# Patient Record
Sex: Female | Born: 1990
Health system: Southern US, Community
[De-identification: ages and names within clinical notes are randomized; demographics above are authoritative.]

## PROBLEM LIST (undated history)

## (undated) ENCOUNTER — Inpatient Hospital Stay: Payer: Self-pay

## (undated) DIAGNOSIS — N390 Urinary tract infection, site not specified: Secondary | ICD-10-CM

## (undated) DIAGNOSIS — R51 Headache: Secondary | ICD-10-CM

## (undated) DIAGNOSIS — F419 Anxiety disorder, unspecified: Secondary | ICD-10-CM

## (undated) DIAGNOSIS — F329 Major depressive disorder, single episode, unspecified: Secondary | ICD-10-CM

## (undated) DIAGNOSIS — F32A Depression, unspecified: Secondary | ICD-10-CM

## (undated) DIAGNOSIS — R519 Headache, unspecified: Secondary | ICD-10-CM

## (undated) HISTORY — DX: Anxiety disorder, unspecified: F41.9

## (undated) HISTORY — DX: Headache: R51

## (undated) HISTORY — DX: Major depressive disorder, single episode, unspecified: F32.9

## (undated) HISTORY — DX: Headache, unspecified: R51.9

## (undated) HISTORY — PX: ESOPHAGOGASTRODUODENOSCOPY: SHX1529

## (undated) HISTORY — DX: Depression, unspecified: F32.A

## (undated) HISTORY — DX: Urinary tract infection, site not specified: N39.0

---
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
## 2008-01-28 IMAGING — US US PELV - US TRANSVAGINAL
1 series · 17 of 25 positions shown · non-contrast
Comparison: none

REASON FOR EXAM: pelvic pain
COMMENTS:

[Series 1: us pelv - us transvaginal · 17 of 29 slices shown]
[im 1/29]
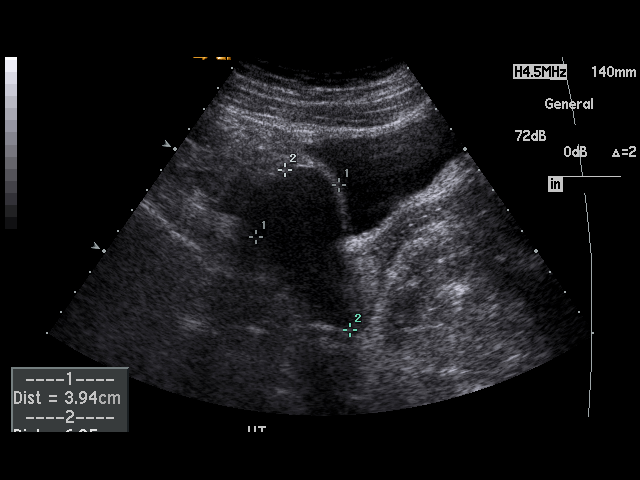
[im 3/29]
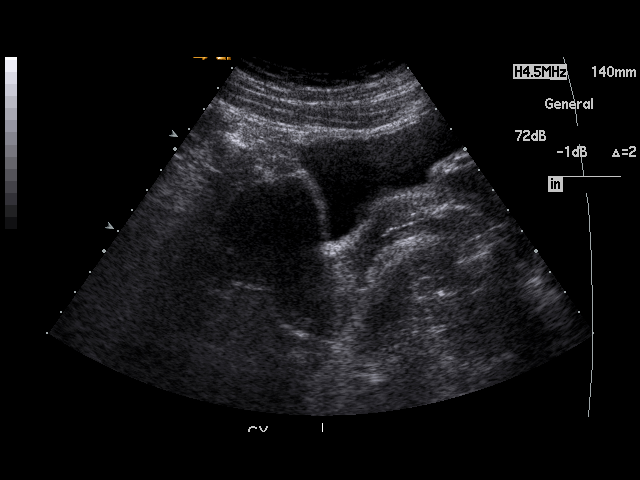
[im 4/29]
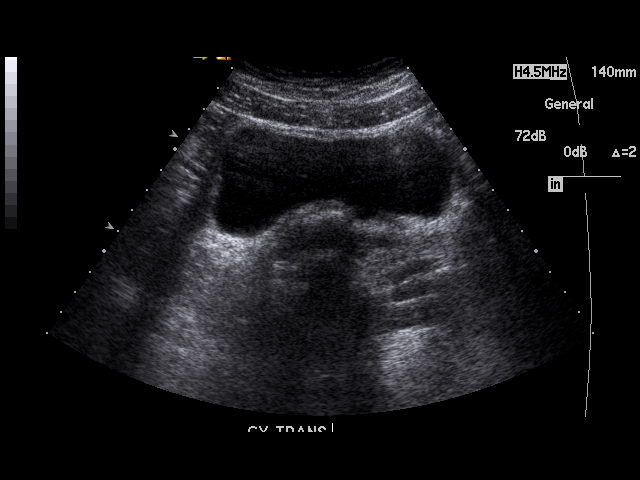
[im 6/29]
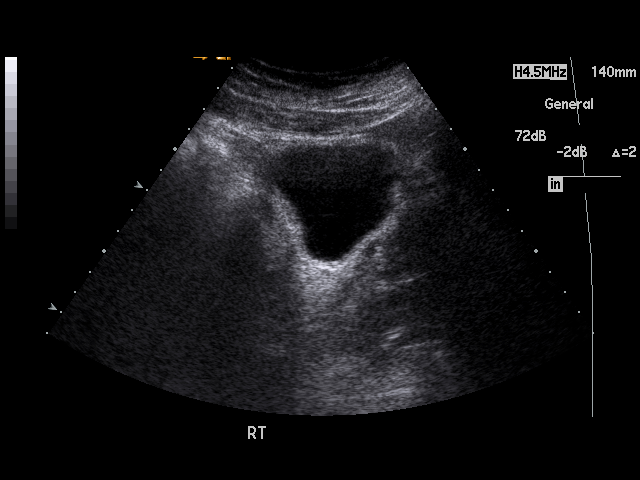
[im 8/29]
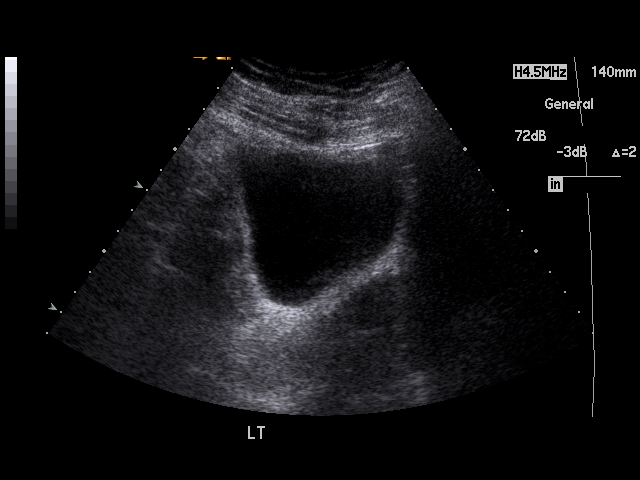
[im 10/29]
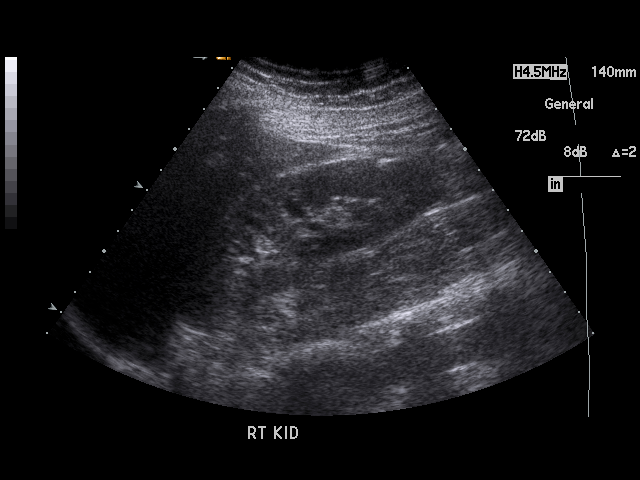
[im 11/29]
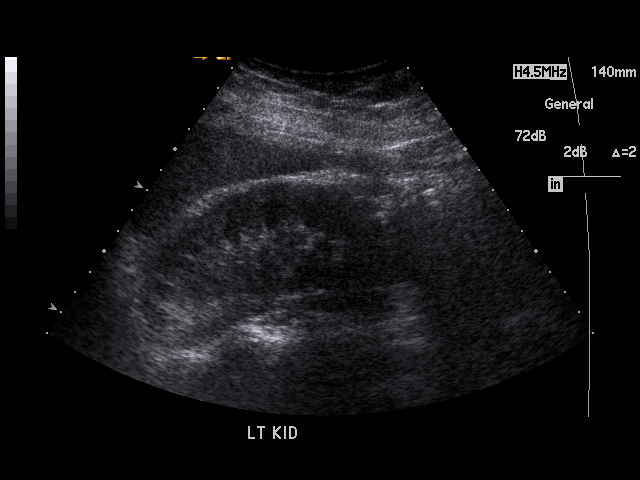
[im 13/29]
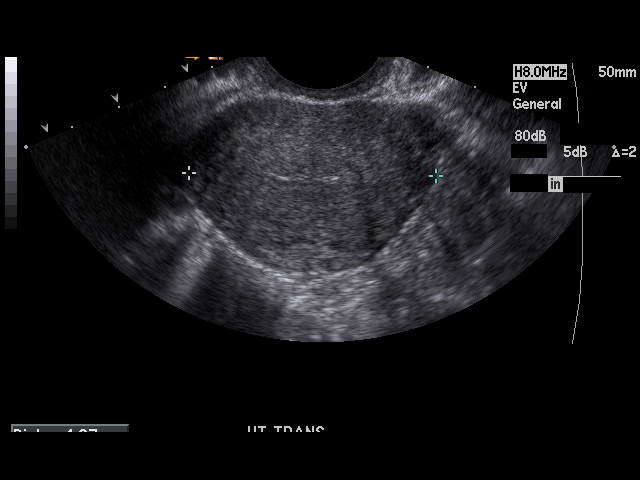
[im 15/29]
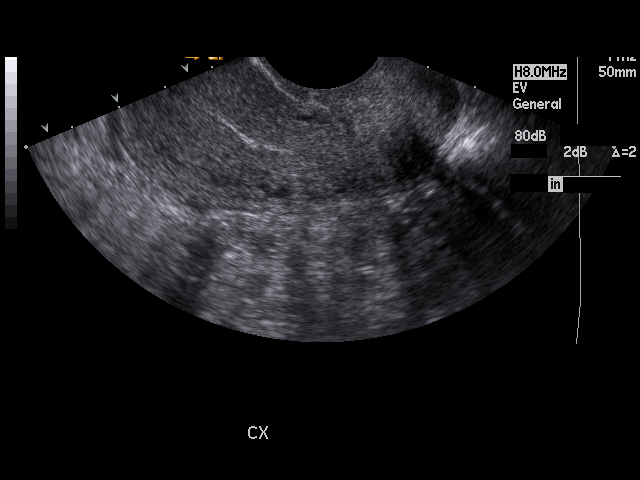
[im 16/29]
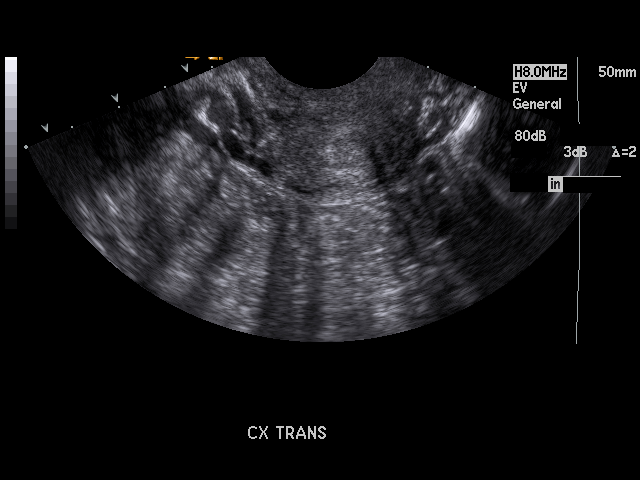
[im 18/29]
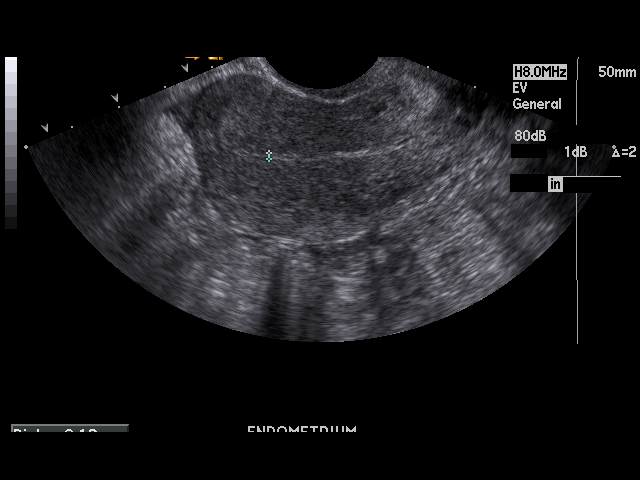
[im 19/29]
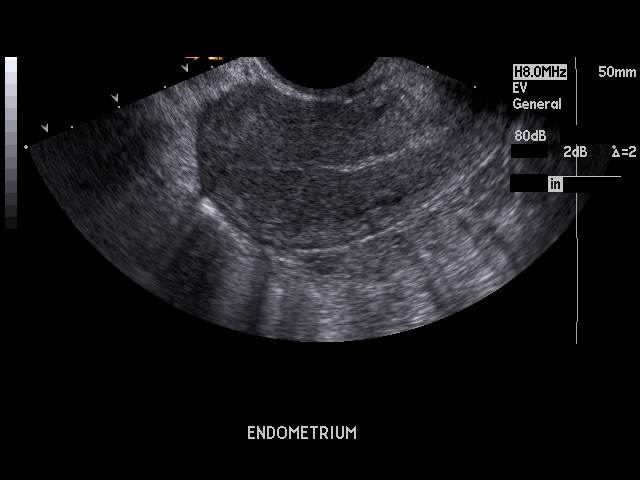
[im 22/29]
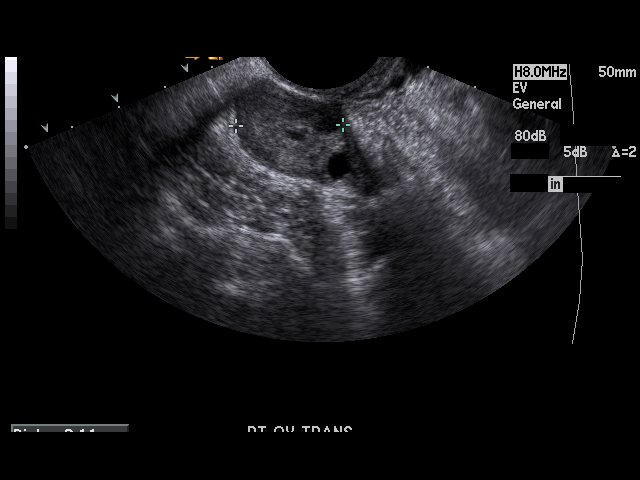
[im 23/29]
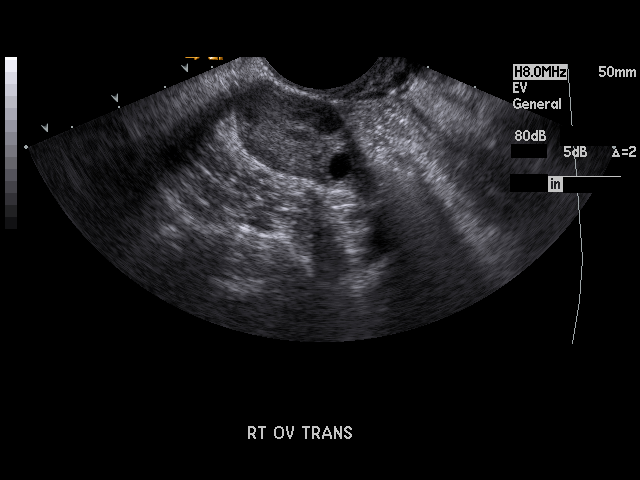
[im 25/29]
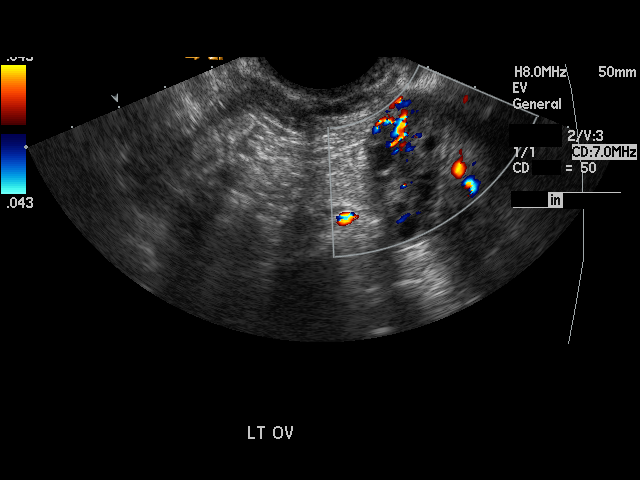
[im 26/29]
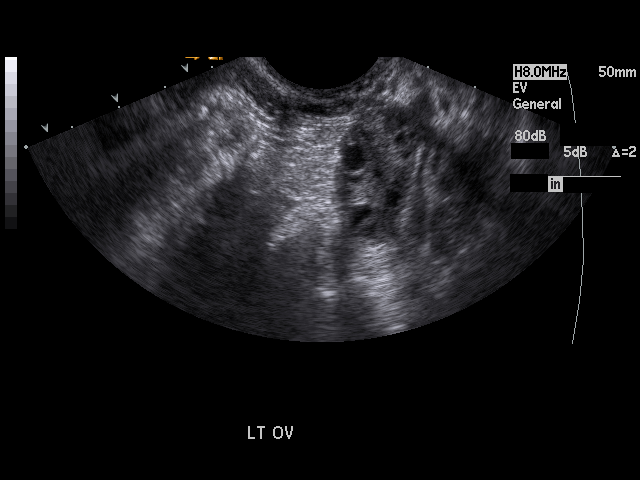
[im 29/29]
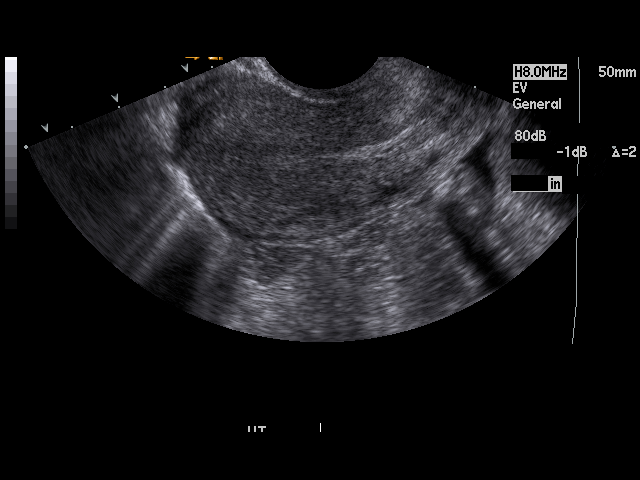

[17 of 25 positions shown; findings below may reference images not displayed]

PROCEDURE:     US  - US PELVIS MASS EXAM  - [DATE] [DATE] [DATE]  [DATE]

RESULT:     The uterus measures 6.77 cm x 3.18 cm x 4.87 cm.  The
endometrium measures 1.2 mm in thickness.  No uterine mass lesions are seen.
The RIGHT ovary measures 3.12 cm at maximum diameter and the LEFT ovary
measures 3.04 cm at maximum diameter. No abnormal adnexal masses are seen.
There is no free fluid in the pelvis. The kidneys show no hydronephrosis.
There is no ascites. The visualized portion of the urinary bladder is normal
in appearance.
IMPRESSION: 1. No significant abnormalities are identified.
2. Transabdominal and endovaginal ultrasound was performed.

## 2008-03-01 ENCOUNTER — Emergency Department: Payer: Self-pay | Admitting: Emergency Medicine
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
## 2009-12-12 ENCOUNTER — Emergency Department: Payer: Self-pay | Admitting: Emergency Medicine

## 2010-02-01 ENCOUNTER — Emergency Department: Payer: Self-pay | Admitting: Unknown Physician Specialty

## 2011-09-09 ENCOUNTER — Emergency Department: Payer: Self-pay | Admitting: Emergency Medicine

## 2011-09-09 LAB — COMPREHENSIVE METABOLIC PANEL
Albumin: 3 g/dL — ABNORMAL LOW (ref 3.4–5.0)
Alkaline Phosphatase: 44 U/L — ABNORMAL LOW (ref 50–136)
Anion Gap: 9 (ref 7–16)
BUN: 6 mg/dL — ABNORMAL LOW (ref 7–18)
Bilirubin,Total: 0.2 mg/dL (ref 0.2–1.0)
Calcium, Total: 8.4 mg/dL — ABNORMAL LOW (ref 8.5–10.1)
Co2: 23 mmol/L (ref 21–32)
Creatinine: 0.41 mg/dL — ABNORMAL LOW (ref 0.60–1.30)
EGFR (African American): >60
Osmolality: 272 (ref 275–301)
Potassium: 3.9 mmol/L (ref 3.5–5.1)
SGPT (ALT): 14 U/L
Sodium: 138 mmol/L (ref 136–145)

## 2011-09-09 LAB — URINALYSIS, COMPLETE
Bilirubin,UR: NEGATIVE
Ph: 7 (ref 4.5–8.0)
Protein: NEGATIVE
RBC,UR: 1 /HPF (ref 0–5)
Specific Gravity: 1.012 (ref 1.003–1.030)

## 2011-09-09 LAB — CBC
HCT: 36.7 % (ref 35.0–47.0)
MCH: 30.5 pg (ref 26.0–34.0)
MCHC: 34.9 g/dL (ref 32.0–36.0)
MCV: 87 fL (ref 80–100)
Platelet: 180 10*3/uL (ref 150–440)
RBC: 4.21 10*6/uL (ref 3.80–5.20)
WBC: 10.5 10*3/uL (ref 3.6–11.0)

## 2011-09-11 LAB — URINE CULTURE

## 2011-09-25 ENCOUNTER — Emergency Department: Payer: Self-pay | Admitting: Unknown Physician Specialty

## 2011-09-25 LAB — URINALYSIS, COMPLETE
Glucose,UR: NEGATIVE mg/dL (ref 0–75)
Ketone: NEGATIVE
Ph: 8 (ref 4.5–8.0)
Protein: NEGATIVE
RBC,UR: 1 /HPF (ref 0–5)
Squamous Epithelial: 5

## 2011-09-25 LAB — COMPREHENSIVE METABOLIC PANEL
Albumin: 3.1 g/dL — ABNORMAL LOW (ref 3.4–5.0)
Anion Gap: 10 (ref 7–16)
Bilirubin,Total: 0.4 mg/dL (ref 0.2–1.0)
Calcium, Total: 8.3 mg/dL — ABNORMAL LOW (ref 8.5–10.1)
Chloride: 107 mmol/L (ref 98–107)
Creatinine: 0.49 mg/dL — ABNORMAL LOW (ref 0.60–1.30)
EGFR (African American): >60
EGFR (Non-African Amer.): >60
Osmolality: 274 (ref 275–301)
Potassium: 3.8 mmol/L (ref 3.5–5.1)
SGOT(AST): 11 U/L — ABNORMAL LOW (ref 15–37)
SGPT (ALT): 16 U/L
Total Protein: 6.7 g/dL (ref 6.4–8.2)

## 2011-09-25 LAB — HCG, QUANTITATIVE, PREGNANCY: Beta Hcg, Quant.: 20276 m[IU]/mL — ABNORMAL HIGH

## 2011-09-25 LAB — CBC
HGB: 13.3 g/dL (ref 12.0–16.0)
MCHC: 34.1 g/dL (ref 32.0–36.0)
MCV: 90 fL (ref 80–100)
Platelet: 140 10*3/uL — ABNORMAL LOW (ref 150–440)
RBC: 4.36 10*6/uL (ref 3.80–5.20)
RDW: 13.7 % (ref 11.5–14.5)

## 2012-02-19 ENCOUNTER — Observation Stay: Payer: Self-pay

## 2012-03-10 ENCOUNTER — Inpatient Hospital Stay: Payer: Self-pay

## 2012-03-10 LAB — CBC WITH DIFFERENTIAL/PLATELET
Basophil #: 0 10*3/uL (ref 0.0–0.1)
Eosinophil #: 0 10*3/uL (ref 0.0–0.7)
Eosinophil %: 0.3 %
HCT: 37.7 % (ref 35.0–47.0)
HGB: 13.4 g/dL (ref 12.0–16.0)
Lymphocyte #: 1.9 10*3/uL (ref 1.0–3.6)
Lymphocyte %: 16.6 %
MCHC: 35.4 g/dL (ref 32.0–36.0)
MCV: 90 fL (ref 80–100)
Monocyte #: 0.5 x10 3/mm (ref 0.2–0.9)
Neutrophil #: 8.9 10*3/uL — ABNORMAL HIGH (ref 1.4–6.5)
RBC: 4.21 10*6/uL (ref 3.80–5.20)
RDW: 13.3 % (ref 11.5–14.5)

## 2012-03-12 LAB — HEMATOCRIT: HCT: 32 % — ABNORMAL LOW (ref 35.0–47.0)

## 2013-06-20 ENCOUNTER — Ambulatory Visit: Payer: Self-pay | Admitting: Podiatry

## 2013-07-04 ENCOUNTER — Encounter: Payer: Self-pay | Admitting: Podiatry

## 2013-07-04 ENCOUNTER — Ambulatory Visit (INDEPENDENT_AMBULATORY_CARE_PROVIDER_SITE_OTHER): Payer: BC Managed Care – PPO | Admitting: Podiatry

## 2013-07-04 VITALS — BP 129/84 | HR 61 | Resp 16 | Ht 71.0 in | Wt 168.0 lb

## 2013-07-04 DIAGNOSIS — L600 Ingrowing nail: Secondary | ICD-10-CM

## 2013-07-04 DIAGNOSIS — M898X9 Other specified disorders of bone, unspecified site: Secondary | ICD-10-CM

## 2013-07-04 MED ORDER — NEOMYCIN-POLYMYXIN-HC 3.5-10000-1 OT SOLN: OTIC | Status: DC

## 2013-07-04 NOTE — Patient Instructions (Addendum)

## 2013-07-04 NOTE — Progress Notes (Signed)
   Subjective:    Patient ID: Adrienne Blair, female    DOB: December 25, 1990, 23 y.o.   MRN: [id number]  HPI Comments: Toenails are not growing right, both great toenails had trauma to them years ago, also both #2 toenails growing up not out.     Review of Systems  All other systems reviewed and are negative.       Objective:   Physical Exam        Assessment & Plan:

## 2013-07-04 NOTE — Progress Notes (Signed)
Subjective:     Patient ID: Adrienne Blair, female   DOB: 09-Mar-1991, 23 y.o.   MRN: [id number]  HPI patient presents stating I have had trauma to mycotic nails and my second nails of both feet and it's been this way for about 10 years. When they grow out they become painful I. have a lot of problems trimming them and they have been nothing but a nuisance for me   Review of Systems  All other systems reviewed and are negative.       Objective:   Physical Exam  Nursing note and vitals reviewed. Constitutional: She is oriented to person, place, and time.  Cardiovascular: Intact distal pulses.   Musculoskeletal: Normal range of motion.  Neurological: She is oriented to person, place, and time.  Skin: Skin is warm.   neurovascular status intact with range of motion subtalar midtarsal joint adequate and muscle strength within normal limits. Patient has excellent Fill time normal arch height and is found to have damaged thickened incurvated nailbeds hallux and second nails of both feet that are painful and do not appear to have any form of reasonable health to do    Assessment:     Damage nails hallux and second toenails of both feet with incurvation and ingrown components    Plan:     H&P and x-ray reviewed and recommended nail removal do to 10 year history and the problems they are causing. I did explain to her that they would be removed permanently and patient understands this wants procedure understands risk and at this time I infiltrated with 160 mg  of Xylocaine Marcaine mixture and exposed hallux and second nails of both feet and remove the nailbeds in toto exposed the matrix and applied 5 applications of phenol in each nailbed 30 seconds followed by alcohol lavaged and sterile dressings. Instructed on elevation soaks and Band-Aid treatments and discussed the total recovery will probably take about 4 weeks. Patient will be seen back as needed

## 2013-07-18 ENCOUNTER — Ambulatory Visit (INDEPENDENT_AMBULATORY_CARE_PROVIDER_SITE_OTHER): Payer: BC Managed Care – PPO

## 2013-07-18 ENCOUNTER — Ambulatory Visit (INDEPENDENT_AMBULATORY_CARE_PROVIDER_SITE_OTHER): Payer: BC Managed Care – PPO | Admitting: Podiatry

## 2013-07-18 VITALS — Resp 16 | Ht 71.0 in | Wt 188.0 lb

## 2013-07-18 DIAGNOSIS — M79609 Pain in unspecified limb: Secondary | ICD-10-CM

## 2013-07-18 DIAGNOSIS — M779 Enthesopathy, unspecified: Secondary | ICD-10-CM

## 2013-07-18 DIAGNOSIS — M79673 Pain in unspecified foot: Secondary | ICD-10-CM

## 2013-07-18 DIAGNOSIS — M7750 Other enthesopathy of unspecified foot: Secondary | ICD-10-CM

## 2013-07-18 MED ORDER — TRIAMCINOLONE ACETONIDE 10 MG/ML IJ SUSP
10.0000 mg | Freq: Once | INTRAMUSCULAR | Status: AC
  Administered 2013-07-18: 10 mg

## 2013-07-18 MED ORDER — METHYLPREDNISOLONE 4 MG PO KIT: PACK | ORAL | Status: DC

## 2013-07-18 NOTE — Progress Notes (Signed)
Subjective:     Patient ID: Adrienne Blair, female   DOB: 07-05-1990, 23 y.o.   MRN: [id number]  HPI patient states my toenails are healing fine but I am getting pain in my ankles and I feel like they are swelling has a pad to work a lot of hours   Review of Systems     Objective:   Physical Exam Neurovascular status is intact with the hallux and second nails of both feet crusted over and healing well and discomfort of a significant nature sinus tarsi bilateral ankles with mild to moderate edema    Assessment:     Capsulitis of the sinus tarsi subtalar joint bilateral with edema cannot rule out systemic causes    Plan:     Today I reviewed x-rays and I carefully injected the sinus tarsi bilateral 3 mg Kenalog 5 mg Xylocaine Marcaine mixture and placed on 6 day Medrol Dosepak if this does not improve patient will be seen back

## 2013-12-28 ENCOUNTER — Emergency Department: Payer: Self-pay | Admitting: Internal Medicine

## 2014-01-01 ENCOUNTER — Emergency Department: Payer: Self-pay | Admitting: General Practice

## 2014-05-19 ENCOUNTER — Emergency Department: Payer: Self-pay | Admitting: Emergency Medicine

## 2014-06-08 ENCOUNTER — Emergency Department: Payer: Self-pay | Admitting: Emergency Medicine

## 2014-06-16 ENCOUNTER — Emergency Department: Payer: Self-pay | Admitting: Emergency Medicine

## 2014-08-29 ENCOUNTER — Emergency Department
Admission: EM | Admit: 2014-08-29 | Discharge: 2014-08-30 | Discharge status: Against Medical Advice | Payer: Self-pay | Attending: Emergency Medicine | Admitting: Emergency Medicine

## 2014-08-29 DIAGNOSIS — J029 Acute pharyngitis, unspecified: Secondary | ICD-10-CM | POA: Insufficient documentation

## 2014-08-29 LAB — POCT RAPID STREP A: Streptococcus, Group A Screen (Direct): NEGATIVE

## 2014-08-29 NOTE — ED Notes (Signed)
Pt states sore throat since Thursday. Pt states "i feel like i can't sleep because of the pain".

## 2014-09-01 NOTE — H&P (Signed)
L&D Evaluation:  History:   HPI 24 year old G1P0 presents to L&D at 37 weeks 4 days with c/o decreased FM. She states the movements have been decreased all day then she went for a few hours and felt no movement. She is also having an occas cramp and has been losing mucous plug over last couple days. ED[facility] 03/06/12, [facility] not remarkable for any significant event other than a period of time where she had syncopal episodes but work up was normal.    Presents with decreased fetal movement    Patient's Medical History No Chronic Illness  GERD    Patient's Surgical History endoscopy    Medications Pre Natal Vitamins    Allergies NKDA    Social History tobacco  1/2 ppd    Family History Non-Contributory   ROS:   ROS All systems were reviewed.  HEENT, CNS, GI, GU, Respiratory, CV, Renal and Musculoskeletal systems were found to be normal.   Exam:   Vital Signs stable    Urine Protein not completed    General no apparent distress    Mental Status clear    Abdomen gravid, non-tender    Estimated Fetal Weight Average for gestational age    Back no CVAT    Edema no edema    Pelvic no external lesions, 1cm/thick/-2    Mebranes Intact    FHT see below    Ucx irregular, mainly irritability   Impression:   Impression decreased fetal movement, reactive NST   Plan:   Plan EFM/NST, discharge, U/S done for AFI    Comments when pt first arrived stip had a few accels, then went to baseline of 150 with minimal variability, with no accels and 2 very small variable decels. Sent for U/S for AFI and AFI was 11 cm Pt put back on monitor and given some fluids and something to eat, tracing ultimately ended up being reactive over the course of a few hours. baseline was btwn 130-135, with accels Pt sent home with precautions, has appt in 3 days at office.    Follow Up Appointment already scheduled   Electronic Signatures: Shella Maximutnam, Barbarajean Kinzler (CNM)  (Signed 28-Oct-13  22:49)  Authored: L&D Evaluation   Last Updated: 28-Oct-13 22:49 by Shella MaximPutnam, Shaquavia Whisonant (CNM)

## 2014-09-02 LAB — CULTURE, GROUP A STREP (THRC)

## 2014-10-05 ENCOUNTER — Emergency Department
Admission: EM | Admit: 2014-10-05 | Discharge: 2014-10-05 | Disposition: A | Discharge status: Home / Self Care | Payer: BLUE CROSS/BLUE SHIELD | Attending: Emergency Medicine | Admitting: Emergency Medicine

## 2014-10-05 ENCOUNTER — Emergency Department: Payer: BLUE CROSS/BLUE SHIELD

## 2014-10-05 ENCOUNTER — Encounter: Payer: Self-pay | Admitting: Emergency Medicine

## 2014-10-05 DIAGNOSIS — S3992XA Unspecified injury of lower back, initial encounter: Secondary | ICD-10-CM | POA: Diagnosis present

## 2014-10-05 DIAGNOSIS — Y9389 Activity, other specified: Secondary | ICD-10-CM | POA: Insufficient documentation

## 2014-10-05 DIAGNOSIS — Y92410 Unspecified street and highway as the place of occurrence of the external cause: Secondary | ICD-10-CM | POA: Insufficient documentation

## 2014-10-05 DIAGNOSIS — Y998 Other external cause status: Secondary | ICD-10-CM | POA: Insufficient documentation

## 2014-10-05 DIAGNOSIS — Z79899 Other long term (current) drug therapy: Secondary | ICD-10-CM | POA: Insufficient documentation

## 2014-10-05 DIAGNOSIS — Z720 Tobacco use: Secondary | ICD-10-CM | POA: Diagnosis not present

## 2014-10-05 MED ORDER — IBUPROFEN 800 MG PO TABS: 800.0000 mg | ORAL_TABLET | Freq: Three times a day (TID) | ORAL | Status: DC | PRN

## 2014-10-05 MED ORDER — CYCLOBENZAPRINE HCL 10 MG PO TABS: 10.0000 mg | ORAL_TABLET | Freq: Three times a day (TID) | ORAL | Status: DC | PRN
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
## 2014-10-05 NOTE — Discharge Instructions (Signed)
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
## 2014-10-20 ENCOUNTER — Encounter: Payer: Self-pay | Admitting: Emergency Medicine

## 2014-10-20 ENCOUNTER — Emergency Department
Admission: EM | Admit: 2014-10-20 | Discharge: 2014-10-21 | Disposition: A | Discharge status: Home / Self Care | Payer: BLUE CROSS/BLUE SHIELD | Attending: Student | Admitting: Student

## 2014-10-20 DIAGNOSIS — L03115 Cellulitis of right lower limb: Secondary | ICD-10-CM | POA: Diagnosis not present

## 2014-10-20 DIAGNOSIS — Z8739 Personal history of other diseases of the musculoskeletal system and connective tissue: Secondary | ICD-10-CM | POA: Insufficient documentation

## 2014-10-20 DIAGNOSIS — Z79899 Other long term (current) drug therapy: Secondary | ICD-10-CM | POA: Diagnosis not present

## 2014-10-20 DIAGNOSIS — Z7952 Long term (current) use of systemic steroids: Secondary | ICD-10-CM | POA: Insufficient documentation

## 2014-10-20 DIAGNOSIS — Z720 Tobacco use: Secondary | ICD-10-CM | POA: Diagnosis not present

## 2014-10-20 DIAGNOSIS — M25561 Pain in right knee: Secondary | ICD-10-CM | POA: Diagnosis present

## 2014-10-20 MED ORDER — SULFAMETHOXAZOLE-TRIMETHOPRIM 800-160 MG PO TABS: 1.0000 | ORAL_TABLET | Freq: Two times a day (BID) | ORAL | Status: DC

## 2014-10-20 MED ORDER — SULFAMETHOXAZOLE-TRIMETHOPRIM 800-160 MG PO TABS
1.0000 | ORAL_TABLET | Freq: Once | ORAL | Status: AC
  Administered 2014-10-21: 1 via ORAL

## 2014-10-20 MED ORDER — HYDROCODONE-ACETAMINOPHEN 5-325 MG PO TABS: 1.0000 | ORAL_TABLET | ORAL | Status: DC | PRN

## 2014-10-20 MED ORDER — HYDROCODONE-ACETAMINOPHEN 5-325 MG PO TABS
1.0000 | ORAL_TABLET | Freq: Once | ORAL | Status: AC
  Administered 2014-10-21: 1 via ORAL

## 2014-10-20 NOTE — ED Notes (Signed)
Pt arrived to the ED for complaints of right knee pain x3 days. Pt states that she noticed a bite like bump on her right knee today and thinks that is the cause of the pain. Pt's sensation, range of motion and circulation are intact on affected knee with some swelling noticed. Pt is AOx4 in no apparent distress.

## 2014-10-20 NOTE — ED Provider Notes (Signed)
[facility] Emergency Department Provider Note  ____________________________________________  Time seen: Approximately 11:01 PM  I have reviewed the triage vital signs and the nursing notes.   HISTORY  Chief Complaint Knee Pain   HPI Adrienne Blair is a 24 y.o. female is here complaining of right knee pain 2 days. Patient denies any injury. She states she noticed an insect bite to her knee prior to the swelling and pain. She has not been taking any medications for this last 2 days but today began taking some ibuprofen. There is increased pain this evening making walking difficult. She is describes it as a constant pain that is unrelieved with positioning or elevation. She rates her pain 8 out of 10 at present.    History reviewed. No pertinent past medical history.  There are no active problems to display for this patient.   History reviewed. No pertinent past surgical history.  Current Outpatient Rx  Name  Route  Sig  Dispense  Refill  . cyclobenzaprine (FLEXERIL) 10 MG tablet   Oral   Take 1 tablet (10 mg total) by mouth every 8 (eight) hours as needed for muscle spasms.   30 tablet   1   . HYDROcodone-acetaminophen (NORCO/VICODIN) 5-325 MG per tablet   Oral   Take 1 tablet by mouth every 4 (four) hours as needed for moderate pain.   20 tablet   0   . ibuprofen (ADVIL,MOTRIN) 800 MG tablet   Oral   Take 1 tablet (800 mg total) by mouth every 8 (eight) hours as needed.   30 tablet   0   . methylPREDNISolone (MEDROL DOSEPAK) 4 MG tablet      follow package directions   21 tablet   0   . neomycin-polymyxin-hydrocortisone (CORTISPORIN) otic solution      1 -2  Drops to the toes after soaking twice daily   10 mL   1   . sulfamethoxazole-trimethoprim (BACTRIM DS,SEPTRA DS) 800-160 MG per tablet   Oral   Take 1 tablet by mouth 2 (two) times daily.   20 tablet   0     Allergies Gabapentin and Ultram  History reviewed. No  pertinent family history.  Social History History  Substance Use Topics  . Smoking status: Current Every Day Smoker -- 0.50 packs/day    Types: Cigarettes  . Smokeless tobacco: Never Used  . Alcohol Use: No    Review of Systems Constitutional: Questionable fever/negative chills Eyes: No visual changes. ENT: No sore throat. Cardiovascular: Denies chest pain. Respiratory: Denies shortness of breath. Gastrointestinal: No abdominal pain.  No nausea, no vomiting.  Musculoskeletal: History of back pain in the past Skin: Positive for localized papule with erythema Neurological: Negative for headaches, focal weakness or numbness.  10-point ROS otherwise negative.  ____________________________________________   PHYSICAL EXAM:  VITAL SIGNS: ED Triage Vitals  Enc Vitals Group     BP 10/20/14 2229 114/75 mmHg     Pulse Rate 10/20/14 2229 67     Resp 10/20/14 2229 18     Temp 10/20/14 2229 97.8 F (36.6 C)     Temp Source 10/20/14 2229 Oral     SpO2 10/20/14 2229 96 %     Weight 10/20/14 2229 170 lb (77.111 kg)     Height 10/20/14 2229 5\' 11"  (1.803 m)     Head Cir --      Peak Flow --      Pain Score 10/20/14 2236 8  Pain Loc --      Pain Edu? --      Excl. in GC? --     Constitutional: Alert and oriented. Well appearing and in no acute distress. Eyes: Conjunctivae are normal. PERRL. EOMI. Head: Atraumatic. Nose: No congestion/rhinnorhea. Neck: No stridor.   Cardiovascular: Normal rate, regular rhythm. Grossly normal heart sounds.  Good peripheral circulation. Respiratory: Normal respiratory effort.  No retractions. Lungs CTAB. Gastrointestinal: Soft and nontender. No distention. Musculoskeletal: No lower extremity tenderness nor edema.  No joint effusions. Neurologic:  Normal speech and language. No gross focal neurologic deficits are appreciated. Speech is normal. No gait instability. Skin:  Skin is warm, dry and intact. There is a single erythematous papule medial  aspect of right knee. Moderate tenderness on palpation. No localized abscess at this time. Psychiatric: Mood and affect are normal. Speech and behavior are normal.  ____________________________________________   LABS (all labs ordered are listed, but only abnormal results are displayed)  Labs Reviewed - No data to display ____________________________________________  PROCEDURES  Procedure(s) performed: None  Critical Care performed: No  ____________________________________________   INITIAL IMPRESSION / ASSESSMENT AND PLAN / ED COURSE  Pertinent labs & imaging results that were available during my care of the patient were reviewed by me and considered in my medical decision making (see chart for details).  She was started on Septra DS 10 days for infection and Norco as needed for pain. She is to follow-up with her PCP if any continued problems or return to the emergency room if any severe worsening of her symptoms. ____________________________________________   FINAL CLINICAL IMPRESSION(S) / ED DIAGNOSES  Final diagnoses:  Cellulitis of right lower extremity      Tommi Rumps, PA-C 10/20/14 2321  Gayla Doss, MD 10/24/14 [phone]

## 2014-10-20 NOTE — ED Notes (Signed)
Patient reports that her right knee has been swelling the last 2 nights. Patient denies any injury. Patient states that today she noticed an insect bite to her knee.

## 2014-10-20 NOTE — Discharge Instructions (Signed)
FOLLOW UP WITH YOUR DOCTOR IF ANY CONTINUED PROBLEMS IN 2-3 DAYS MOIST WARM COMPRESSES TO AREA FREQUENTLY TAKE SEPTRA DS FOR INFECTION AND NORCO FOR PAIN AS DIRECTED

## 2014-10-21 MED ORDER — HYDROCODONE-ACETAMINOPHEN 5-325 MG PO TABS
ORAL_TABLET | ORAL | Status: AC
  Filled 2014-10-21: qty 1

## 2014-10-21 MED ORDER — SULFAMETHOXAZOLE-TRIMETHOPRIM 800-160 MG PO TABS
ORAL_TABLET | ORAL | Status: AC
  Filled 2014-10-21: qty 1

## 2014-11-10 ENCOUNTER — Ambulatory Visit: Payer: BLUE CROSS/BLUE SHIELD | Admitting: Family Medicine

## 2014-11-24 ENCOUNTER — Telehealth: Payer: Self-pay

## 2014-11-24 ENCOUNTER — Encounter (INDEPENDENT_AMBULATORY_CARE_PROVIDER_SITE_OTHER): Payer: Self-pay

## 2014-11-24 ENCOUNTER — Other Ambulatory Visit: Payer: Self-pay | Admitting: Family Medicine

## 2014-11-24 ENCOUNTER — Encounter: Payer: Self-pay | Admitting: Family Medicine

## 2014-11-24 ENCOUNTER — Ambulatory Visit (INDEPENDENT_AMBULATORY_CARE_PROVIDER_SITE_OTHER): Payer: BLUE CROSS/BLUE SHIELD | Admitting: Family Medicine

## 2014-11-24 VITALS — BP 116/64 | HR 75 | Temp 98.2°F | Ht 69.25 in | Wt 165.1 lb

## 2014-11-24 DIAGNOSIS — F418 Other specified anxiety disorders: Secondary | ICD-10-CM

## 2014-11-24 DIAGNOSIS — M25572 Pain in left ankle and joints of left foot: Secondary | ICD-10-CM | POA: Diagnosis not present

## 2014-11-24 DIAGNOSIS — M25579 Pain in unspecified ankle and joints of unspecified foot: Secondary | ICD-10-CM | POA: Insufficient documentation

## 2014-11-24 DIAGNOSIS — Z0000 Encounter for general adult medical examination without abnormal findings: Secondary | ICD-10-CM | POA: Insufficient documentation

## 2014-11-24 DIAGNOSIS — M25571 Pain in right ankle and joints of right foot: Secondary | ICD-10-CM | POA: Diagnosis not present

## 2014-11-24 DIAGNOSIS — F419 Anxiety disorder, unspecified: Secondary | ICD-10-CM

## 2014-11-24 DIAGNOSIS — F329 Major depressive disorder, single episode, unspecified: Secondary | ICD-10-CM | POA: Insufficient documentation

## 2014-11-24 DIAGNOSIS — F32A Depression, unspecified: Secondary | ICD-10-CM

## 2014-11-24 MED ORDER — MELOXICAM 15 MG PO TABS: 15.0000 mg | ORAL_TABLET | Freq: Every day | ORAL | Status: DC

## 2014-11-24 MED ORDER — CITALOPRAM HYDROBROMIDE 20 MG PO TABS: 20.0000 mg | ORAL_TABLET | Freq: Every day | ORAL | Status: DC

## 2014-11-24 NOTE — Assessment & Plan Note (Signed)
Treating with Celexa. Patient to follow up closely.

## 2014-11-24 NOTE — Patient Instructions (Signed)
Take the Celexa daily for depression/anxiety.  You can continue tylenol as needed.  Take the meloxicam (mobic) in place of ibuprofen daily as needed.  Be sure to follow up with your OB-GYN.  Follow up with me in ~ 3 months.   Take care  Dr. Adriana Simas

## 2014-11-24 NOTE — Telephone Encounter (Signed)
Pt medical release form was sent to [facility] to get her records faxed to [place].

## 2014-11-24 NOTE — Progress Notes (Signed)
Subjective:    Patient ID: Adrienne Blair, female    DOB: 1990/11/05, 24 y.o.   MRN: [id number]  HPI 24 year old female presents to the clinic today to establish care. Current issues/complaints are below.  1) Depression/anxiety  Patient reports she has long-standing history of depression and anxiety.  She states it began in high school.  She's been on medication for this in the past (Prozac, Zoloft, Klonopin)  She states that she had some relief with the Klonopin but not regular medications.  She states that this continues to be troublesome for her and particularly interferes with her relationships and at work.  She states that her symptoms are exacerbated when she is at work and in stressful conditions.  Patient would like to discuss pharmacotherapy today.  2) Joint pain  Patient reports that she has long-standing pain particularly of the ankles, knees.  She also has intermittent low back pain.  Pain is moderate in severity.  Patient states that she has been seen for this previously by other physicians with little success.  She reports that she currently continues to be bothered by ankle and knee pain but more so right ankle pain.  She takes ibuprofen on a regular basis with some improvement.  She reports that her pain is exacerbated by being on her feet/physical activity.  She reports that she has had some intermittent joint swelling but does not have any currently.  No associated fever, chills.   PMH, Surgical Hx, Family Hx, Social History reviewed and updated as below.  Past Medical History  Diagnosis Date  . Anxiety   . Frequent headaches   . UTI (urinary tract infection)   . Depression    Past Surgical History  Procedure Laterality Date  . Esophagogastroduodenoscopy     Family History  Problem Relation Age of Onset  . Hyperlipidemia Mother   . Hypertension Mother   . Diabetes Mother   . Heart disease Maternal Grandmother   . Hypertension  Maternal Grandmother   . Cancer Maternal Grandfather     lung  . Diabetes Paternal Grandmother   . Diabetes Paternal Grandfather    History   Social History  . Marital Status: Unknown    Spouse Name: N/A  . Number of Children: N/A  . Years of Education: N/A   Social History Main Topics  . Smoking status: Current Every Day Smoker -- 0.50 packs/day    Types: Cigarettes  . Smokeless tobacco: Never Used  . Alcohol Use: No  . Drug Use: No  . Sexual Activity: Not Currently   Other Topics Concern  . None   Social History Narrative    Review of Systems  Constitutional: Negative for fever and chills.  Musculoskeletal: Positive for back pain, joint swelling and arthralgias.  Psychiatric/Behavioral:       +Depression/anxiety.  All other systems negative.      Objective: Blood pressure 116/64, pulse 75, temperature 98.2 F (36.8 C), temperature source Oral, height 5' 9.25" (1.759 m), weight 165 lb 2 oz (74.9 kg), SpO2 99 %. Body mass index is 24.21 kg/(m^2).    Physical Exam  Constitutional: She is oriented to person, place, and time. She appears well-developed and well-nourished. No distress.  HENT:  Head: Normocephalic and atraumatic.  Nose: Nose normal.  Mouth/Throat: Oropharynx is clear and moist. No oropharyngeal exudate.  Normal TM's bilaterally.   Eyes: Conjunctivae are normal. No scleral icterus.  Neck: Neck supple. No thyromegaly present.  Cardiovascular: Normal rate and regular  rhythm.   No murmur heard. Pulmonary/Chest: Effort normal and breath sounds normal. She has no wheezes. She has no rales.  Abdominal: Soft. She exhibits no distension. There is no tenderness. There is no rebound and no guarding.  Musculoskeletal: She exhibits no edema.  Ankles - Laxity noted with manipulation.  Tender to palpation diffusely.    Lymphadenopathy:    She has no cervical adenopathy.  Neurological: She is alert and oriented to person, place, and time.  Skin: Skin is warm and  dry. No rash noted.  Psychiatric: She has a normal mood and affect.  Vitals reviewed.     Assessment & Plan:  See Problem List

## 2014-11-24 NOTE — Progress Notes (Signed)
Pre visit review using our clinic review tool, if applicable. No additional management support is needed unless otherwise documented below in the visit note. 

## 2014-11-24 NOTE — Assessment & Plan Note (Signed)
Pap smear and immunizations UTD.

## 2014-11-24 NOTE — Assessment & Plan Note (Addendum)
Unclear etiology; Exam unremarkable other than a considerable amount of laxity. Advised PRN NSAID's/Tylenol and close follow up. Also advise good supportive shoes.  Depression/anxiety also likely contributing to pain. Patient may benefit from referral to Sports Med.

## 2014-12-01 DIAGNOSIS — M222X9 Patellofemoral disorders, unspecified knee: Secondary | ICD-10-CM | POA: Insufficient documentation

## 2015-03-01 ENCOUNTER — Ambulatory Visit: Payer: BLUE CROSS/BLUE SHIELD | Admitting: Family Medicine

## 2015-03-01 DIAGNOSIS — Z0289 Encounter for other administrative examinations: Secondary | ICD-10-CM

## 2015-06-01 ENCOUNTER — Encounter: Payer: Self-pay | Admitting: Emergency Medicine

## 2015-06-01 DIAGNOSIS — F17210 Nicotine dependence, cigarettes, uncomplicated: Secondary | ICD-10-CM | POA: Insufficient documentation

## 2015-06-01 DIAGNOSIS — J329 Chronic sinusitis, unspecified: Secondary | ICD-10-CM | POA: Diagnosis not present

## 2015-06-01 NOTE — ED Notes (Signed)
Pt presents to ED with sinus pressure/pain. States she was tx for a sinus infection just before christmas with antibiotics after being seen at urgent care. Pt states a couple of weeks later her symptoms returned. Pt states when she blows her nose it is green in color and states sinus area of face is painful to touch. Pressure and pain increase when lying down. Denies fever at home.

## 2015-06-02 ENCOUNTER — Emergency Department
Admission: EM | Admit: 2015-06-02 | Discharge: 2015-06-02 | Disposition: A | Discharge status: Home / Self Care | Payer: BLUE CROSS/BLUE SHIELD | Attending: Emergency Medicine | Admitting: Emergency Medicine

## 2015-09-05 ENCOUNTER — Encounter: Payer: Self-pay | Admitting: Emergency Medicine

## 2015-09-05 ENCOUNTER — Emergency Department
Admission: EM | Admit: 2015-09-05 | Discharge: 2015-09-06 | Disposition: A | Discharge status: Home / Self Care | Payer: BLUE CROSS/BLUE SHIELD | Attending: Emergency Medicine | Admitting: Emergency Medicine

## 2015-09-05 DIAGNOSIS — F17210 Nicotine dependence, cigarettes, uncomplicated: Secondary | ICD-10-CM | POA: Diagnosis not present

## 2015-09-05 DIAGNOSIS — Z791 Long term (current) use of non-steroidal anti-inflammatories (NSAID): Secondary | ICD-10-CM | POA: Diagnosis not present

## 2015-09-05 DIAGNOSIS — Z79899 Other long term (current) drug therapy: Secondary | ICD-10-CM | POA: Diagnosis not present

## 2015-09-05 DIAGNOSIS — F329 Major depressive disorder, single episode, unspecified: Secondary | ICD-10-CM | POA: Diagnosis not present

## 2015-09-05 DIAGNOSIS — L02412 Cutaneous abscess of left axilla: Secondary | ICD-10-CM

## 2015-09-05 NOTE — ED Notes (Signed)
Pt with abscess to left armpit; noticed about 5 days ago; some drainage; unknown if febrile at home

## 2015-09-06 MED ORDER — HYDROCODONE-ACETAMINOPHEN 5-325 MG PO TABS
2.0000 | ORAL_TABLET | Freq: Once | ORAL | Status: AC
  Administered 2015-09-06: 2 via ORAL
  Filled 2015-09-06: qty 2

## 2015-09-06 MED ORDER — CLINDAMYCIN HCL 150 MG PO CAPS
300.0000 mg | ORAL_CAPSULE | Freq: Once | ORAL | Status: AC
  Administered 2015-09-06: 300 mg via ORAL
  Filled 2015-09-06: qty 2

## 2015-09-06 MED ORDER — HYDROCODONE-ACETAMINOPHEN 5-325 MG PO TABS: 1.0000 | ORAL_TABLET | ORAL | Status: DC | PRN

## 2015-09-06 MED ORDER — CLINDAMYCIN HCL 300 MG PO CAPS: 300.0000 mg | ORAL_CAPSULE | Freq: Three times a day (TID) | ORAL | Status: DC

## 2015-09-06 NOTE — ED Provider Notes (Signed)
[facility] Emergency Department Provider Note  Time seen: 12:03 AM  I have reviewed the triage vital signs and the nursing notes.   HISTORY  Chief Complaint Abscess    HPI Dub MikesMargaret H Yasin is a 25 y.o. female with a past medical history of anxiety, presents the emergency department with an abscess to her left armpit. According to the patient approximately 5 days ago she noticed a swollen tender area to the left armpit. Over the past 1-2 days she has noticed significant drainage from the area but continues to have pain so she came to the emergency department. Denies any fever or vomiting. Does state some nausea. Describes her pain as a 7/10 currently.Dull aching pain.     Past Medical History  Diagnosis Date  . Anxiety   . Frequent headaches   . UTI (urinary tract infection)   . Depression     Patient Active Problem List   Diagnosis Date Noted  . Preventative health care 11/24/2014  . Pain, joint, ankle 11/24/2014  . Anxiety and depression 11/24/2014    Past Surgical History  Procedure Laterality Date  . Esophagogastroduodenoscopy      Current Outpatient Rx  Name  Route  Sig  Dispense  Refill  . Acetaminophen 500 MG coapsule   Oral   Take 500 mg by mouth as needed.         . citalopram (CELEXA) 20 MG tablet   Oral   Take 1 tablet (20 mg total) by mouth daily.   30 tablet   3   . cyclobenzaprine (FLEXERIL) 10 MG tablet   Oral   Take 1 tablet (10 mg total) by mouth every 8 (eight) hours as needed for muscle spasms. Patient not taking: Reported on 11/24/2014   30 tablet   1   . meloxicam (MOBIC) 15 MG tablet   Oral   Take 1 tablet (15 mg total) by mouth daily.   30 tablet   1   . norethindrone-ethinyl estradiol (GILDESS 1/20) 1-20 MG-MCG tablet   Oral   Take 1 tablet by mouth daily.           Allergies Gabapentin; Ketorolac tromethamine; Sulfamethoxazole; and Ultram  Family History  Problem Relation Age of Onset  .  Hyperlipidemia Mother   . Hypertension Mother   . Diabetes Mother   . Heart disease Maternal Grandmother   . Hypertension Maternal Grandmother   . Cancer Maternal Grandfather     lung  . Diabetes Paternal Grandmother   . Diabetes Paternal Grandfather     Social History Social History  Substance Use Topics  . Smoking status: Current Every Day Smoker -- 0.50 packs/day    Types: Cigarettes  . Smokeless tobacco: Never Used  . Alcohol Use: No    Review of Systems Constitutional: Negative for fever. Cardiovascular: Negative for chest pain. Respiratory: Negative for shortness of breath. Gastrointestinal: Negative for abdominal pain. Positive for nausea. Negative for vomiting or diarrhea. Genitourinary: Last menstrual period was 2 weeks ago Skin: Tender swollen area to left axilla. Neurological: Negative for headache 10-point ROS otherwise negative.  ____________________________________________   PHYSICAL EXAM:  VITAL SIGNS: ED Triage Vitals  Enc Vitals Group     BP 09/05/15 2127 138/90 mmHg     Pulse Rate 09/05/15 2127 87     Resp 09/05/15 2127 17     Temp 09/05/15 2127 98.6 F (37 C)     Temp Source 09/05/15 2127 Oral     SpO2 09/05/15  2127 98 %     Weight 09/05/15 2127 168 lb (76.204 kg)     Height 09/05/15 2127  (1.803 m)     Head Cir --      Peak Flow --      Pain Score 09/05/15 2133 9     Pain Loc --      Pain Edu? --      Excl. in GC? --     Constitutional: Alert and oriented. Well appearing and in no distress. Eyes: Normal exam ENT   Head: Normocephalic and atraumatic.   Mouth/Throat: Mucous membranes are moist. Cardiovascular: Normal rate, regular rhythm.  Respiratory: Normal respiratory effort without tachypnea nor retractions. Breath sounds are clear  Gastrointestinal: Soft and nontender. No distention.   Musculoskeletal: Nontender with normal range of motion in all extremities. Patient has approximately 1 x 2 cm area of erythema, mild  edema and tenderness to the left axilla consistent with a ruptured abscess. No drainage currently. The top of the abscess has been unroofed by patient. Neurologic:  Normal speech and language. No gross focal neurologic deficits Skin:  Skin is warm, dry. Abscess as above. Psychiatric: Mood and affect are normal.  ____________________________________________    INITIAL IMPRESSION / ASSESSMENT AND PLAN / ED COURSE  Pertinent labs & imaging results that were available during my care of the patient were reviewed by me and considered in my medical decision making (see chart for details).  Patient presents the emergency department with an abscess to her left axilla. Patient has unroofed the abscess on her own. States for the past 2 days and has been draining but it drainage largely stopped today. Denies fever or vomiting. Overall the patient appears very well. We'll place the patient on clindamycin that she has a sulfa allergy. We will discharge on Norco for pain control. I discussed with the patient follow up with her primary care physician in 2-3 days for recheck/reevaluation as well as return precautions for worsening infection, fever or vomiting.  ____________________________________________   FINAL CLINICAL IMPRESSION(S) / ED DIAGNOSES  Left axilla abscess   Minna Antis, MD 09/06/15 0006

## 2015-09-06 NOTE — Discharge Instructions (Signed)

## 2015-09-06 NOTE — ED Notes (Signed)
Discharge instructions reviewed with patient. Patient verbalized understanding. Patient ambulated to lobby without difficulty.   

## 2016-04-24 NOTE — L&D Delivery Note (Addendum)
Delivery Note Primary OB: [facility] Delivery Physician: Annamarie Major, MD Gestational Age: Full term Antepartum complications: none Intrapartum complications: None  A viable Female was delivered via vertex perentation.  Apgars:9 ,9  Weight:  pending .   Placenta status: spontaneous and Intact.  Cord: 3+ vessels;  with the following complications: none.  Anesthesia:  epidural Episiotomy:  none Lacerations:  none Suture Repair: none Est. Blood Loss (mL):  200 mL  Mom to postpartum.  Baby to Couplet care / Skin to Skin.  Annamarie Major, MD, [facility], [facility] 08/16/2016  10:06 AM [phone]

## 2016-04-27 LAB — OB RESULTS CONSOLE GC/CHLAMYDIA
CHLAMYDIA, DNA PROBE: NEGATIVE
GC PROBE AMP, GENITAL: NEGATIVE

## 2016-04-27 LAB — OB RESULTS CONSOLE ABO/RH: RH TYPE: NEGATIVE

## 2016-04-27 LAB — OB RESULTS CONSOLE ANTIBODY SCREEN: ANTIBODY SCREEN: NEGATIVE

## 2016-04-27 LAB — OB RESULTS CONSOLE HIV ANTIBODY (ROUTINE TESTING): HIV: NONREACTIVE

## 2016-04-27 LAB — OB RESULTS CONSOLE VARICELLA ZOSTER ANTIBODY, IGG: Varicella: IMMUNE

## 2016-04-27 LAB — OB RESULTS CONSOLE HEPATITIS B SURFACE ANTIGEN: Hepatitis B Surface Ag: NEGATIVE

## 2016-04-27 LAB — OB RESULTS CONSOLE RUBELLA ANTIBODY, IGM: Rubella: IMMUNE

## 2016-04-27 LAB — OB RESULTS CONSOLE RPR: RPR: NONREACTIVE

## 2016-05-31 DIAGNOSIS — Z3490 Encounter for supervision of normal pregnancy, unspecified, unspecified trimester: Secondary | ICD-10-CM | POA: Diagnosis not present

## 2016-05-31 DIAGNOSIS — Z3A28 28 weeks gestation of pregnancy: Secondary | ICD-10-CM | POA: Diagnosis not present

## 2016-05-31 DIAGNOSIS — Z113 Encounter for screening for infections with a predominantly sexual mode of transmission: Secondary | ICD-10-CM | POA: Diagnosis not present

## 2016-05-31 DIAGNOSIS — Z131 Encounter for screening for diabetes mellitus: Secondary | ICD-10-CM | POA: Diagnosis not present

## 2016-06-14 DIAGNOSIS — Z23 Encounter for immunization: Secondary | ICD-10-CM | POA: Diagnosis not present

## 2016-06-29 ENCOUNTER — Ambulatory Visit (INDEPENDENT_AMBULATORY_CARE_PROVIDER_SITE_OTHER): Payer: BLUE CROSS/BLUE SHIELD | Admitting: Certified Nurse Midwife

## 2016-06-29 ENCOUNTER — Encounter: Payer: Self-pay | Admitting: Certified Nurse Midwife

## 2016-06-29 VITALS — BP 114/66 | HR 88 | Wt 200.0 lb

## 2016-06-29 DIAGNOSIS — O0993 Supervision of high risk pregnancy, unspecified, third trimester: Secondary | ICD-10-CM

## 2016-06-29 DIAGNOSIS — O0990 Supervision of high risk pregnancy, unspecified, unspecified trimester: Secondary | ICD-10-CM

## 2016-06-29 DIAGNOSIS — Z3A33 33 weeks gestation of pregnancy: Secondary | ICD-10-CM

## 2016-06-29 NOTE — Progress Notes (Signed)
Pt c/o mucousy discharge and cramping Tuesday night. Pelvic pressure when going to the bathroom.

## 2016-06-29 NOTE — Progress Notes (Signed)
Doing well. Having some pelvic pains. BAby active. BH contractions Will be breast feeding/ Nexplanon for BC Hx of anxiety and depression-feels she is doing OK now, monitor for worsening postpartum Sibling class schedule given Glucola 134 POsitive antiD, but had Rhogam week before labs drawn.

## 2016-07-10 ENCOUNTER — Inpatient Hospital Stay
Admission: EM | Admit: 2016-07-10 | Discharge: 2016-07-10 | Disposition: A | Discharge status: Home / Self Care | Payer: BLUE CROSS/BLUE SHIELD | Attending: Certified Nurse Midwife | Admitting: Certified Nurse Midwife

## 2016-07-10 DIAGNOSIS — O26893 Other specified pregnancy related conditions, third trimester: Secondary | ICD-10-CM

## 2016-07-10 DIAGNOSIS — B373 Candidiasis of vulva and vagina: Secondary | ICD-10-CM | POA: Diagnosis not present

## 2016-07-10 DIAGNOSIS — F329 Major depressive disorder, single episode, unspecified: Secondary | ICD-10-CM | POA: Diagnosis not present

## 2016-07-10 DIAGNOSIS — N898 Other specified noninflammatory disorders of vagina: Secondary | ICD-10-CM

## 2016-07-10 DIAGNOSIS — N760 Acute vaginitis: Secondary | ICD-10-CM | POA: Diagnosis not present

## 2016-07-10 DIAGNOSIS — O23593 Infection of other part of genital tract in pregnancy, third trimester: Secondary | ICD-10-CM | POA: Insufficient documentation

## 2016-07-10 DIAGNOSIS — O99333 Smoking (tobacco) complicating pregnancy, third trimester: Secondary | ICD-10-CM | POA: Diagnosis not present

## 2016-07-10 DIAGNOSIS — O99343 Other mental disorders complicating pregnancy, third trimester: Secondary | ICD-10-CM | POA: Insufficient documentation

## 2016-07-10 DIAGNOSIS — Z3A35 35 weeks gestation of pregnancy: Secondary | ICD-10-CM | POA: Insufficient documentation

## 2016-07-10 DIAGNOSIS — F419 Anxiety disorder, unspecified: Secondary | ICD-10-CM | POA: Insufficient documentation

## 2016-07-10 MED ORDER — TERCONAZOLE 0.8 % VA CREA: 1.0000 | TOPICAL_CREAM | Freq: Every day | VAGINAL | 0 refills | Status: DC

## 2016-07-10 NOTE — Final Progress Note (Signed)
Physician Final Progress Note  Patient ID: Dub MikesMargaret H Gowdy MRN: [id number] DOB/AGE: 04-28-1990 28 y.o.  Admit date: 07/10/2016 Admitting provider: Vena AustriaAndreas Staebler, MD Discharge date: 07/10/2016   Admission Diagnoses: IUP at 35 weeks with vaginal discharge  Discharge Diagnoses:  IUP at 35 weeks with monilial vulvovaginitis  Consults: None  Significant Findings/ Diagnostic Studies: 26 yo G3 P1011 with EDC=08/14/2016 by a LMP=25 week ultrasound presented at 35 weeks with c/o leakage of fluid last night and again this AM. FLuid was clear to white in color. Has had some itching. Baby active. Has not eaten this AM. Drinks little water. [facility] remarkable for late entry to care and being RH negative (received Rhogam 1/31). Past Medical History:  Diagnosis Date  . Anxiety   . Depression   . Frequent headaches   . UTI (urinary tract infection)    Social History   Social History  . Marital status: Unknown    Spouse name: N/A  . Number of children: N/A  . Years of education: N/A   Occupational History  . Not on file.   Social History Main Topics  . Smoking status: Current Every Day Smoker    Packs/day: 0.50    Types: Cigarettes  . Smokeless tobacco: Never Used  . Alcohol use No  . Drug use: No  . Sexual activity: Not Currently   Other Topics Concern  . Not on file   Social History Narrative  . No narrative on file  Exam: General: WF, in NAD BP 114/72 (BP Location: Right Arm)   Pulse (!) 102   Temp 98.5 F (36.9 C) (Oral)   Resp 18   Ht 5\' 11"  (1.803 m)   Wt 96.2 kg (212 lb)   LMP 11/08/2015   BMI 29.57 kg/m   FHR: 150 baseline initially then 140s with accelerations to 160s to 170, moderate variability Toco: some uterine irritability, occasional ctx SSE: Vulva: inflamed labia, swollen inner labia Vagina: white discharge Nitrazine neg Wet prep: positive hyphae, neg Trich and clue cells A: Monilial vulvovaginitis. No evidence of  SROM P: Terazol3 cream qhs x 3 nights. Apply to vulva 2-3x/day prn  Procedures: none  Discharge Condition: good  Disposition: 01-Home or Self Care  Diet: Regular diet  Discharge Activity: Activity as tolerated  Discharge Instructions    Discharge patient    Complete by:  As directed    Discharge disposition:  01-Home or Self Care   Discharge patient date:  07/10/2016     Allergies as of 07/10/2016      Reactions   Gabapentin Swelling   Ketorolac Tromethamine    Sulfamethoxazole Diarrhea, Nausea And Vomiting   Ultram [tramadol] Nausea Only      Medication List    TAKE these medications   multivitamin-prenatal 27-0.8 MG Tabs tablet Take 1 tablet by mouth daily at 12 noon.   terconazole 0.8 % vaginal cream Commonly known as:  TERAZOL 3 Place 1 applicator vaginally at bedtime. Can also apply externally 2-3 x/day prn vulvar irritation or itching     [facility]   Total time spent taking care of this patient: 15 minutes  Signed: Farrel Connersolleen Tomesha Sargent 07/10/2016, 11:30 AM

## 2016-07-10 NOTE — OB Triage Note (Signed)
Ms Adrienne Blair here with c/o LOF, once last PM and once this AM, small amt. Denies bleeding, reports positive fetal movement. Reports occasional contractions, (4-6 per day), irregular lower back pain.

## 2016-07-10 NOTE — Progress Notes (Signed)
Patient given apple juice, crackers and pb

## 2016-07-12 ENCOUNTER — Ambulatory Visit (INDEPENDENT_AMBULATORY_CARE_PROVIDER_SITE_OTHER): Payer: BLUE CROSS/BLUE SHIELD | Admitting: Obstetrics and Gynecology

## 2016-07-12 VITALS — BP 118/80 | Wt 202.0 lb

## 2016-07-12 DIAGNOSIS — Z3A35 35 weeks gestation of pregnancy: Secondary | ICD-10-CM

## 2016-07-12 DIAGNOSIS — Z3400 Encounter for supervision of normal first pregnancy, unspecified trimester: Secondary | ICD-10-CM

## 2016-07-12 NOTE — Progress Notes (Signed)
Pos PNVs. No VB, LOF. Treated for yeast vag at L&D 2 days ago with sx improvement. GERD--no relief with tums/zantac.

## 2016-07-19 ENCOUNTER — Ambulatory Visit (INDEPENDENT_AMBULATORY_CARE_PROVIDER_SITE_OTHER): Payer: BLUE CROSS/BLUE SHIELD | Admitting: Advanced Practice Midwife

## 2016-07-19 VITALS — BP 116/72 | Wt 205.0 lb

## 2016-07-19 DIAGNOSIS — Z3685 Encounter for antenatal screening for Streptococcus B: Secondary | ICD-10-CM | POA: Diagnosis not present

## 2016-07-19 DIAGNOSIS — Z113 Encounter for screening for infections with a predominantly sexual mode of transmission: Secondary | ICD-10-CM

## 2016-07-19 DIAGNOSIS — Z3A36 36 weeks gestation of pregnancy: Secondary | ICD-10-CM

## 2016-07-19 NOTE — Progress Notes (Signed)
gbs/aptima

## 2016-07-19 NOTE — Progress Notes (Signed)
Doing well. Urine looks concentrated. Encouraged increased hydration. GBS/aptima today.

## 2016-07-21 ENCOUNTER — Observation Stay
Admission: EM | Admit: 2016-07-21 | Discharge: 2016-07-21 | Disposition: A | Discharge status: Home / Self Care | Payer: BLUE CROSS/BLUE SHIELD | Attending: Obstetrics & Gynecology | Admitting: Obstetrics & Gynecology

## 2016-07-21 DIAGNOSIS — F17210 Nicotine dependence, cigarettes, uncomplicated: Secondary | ICD-10-CM | POA: Insufficient documentation

## 2016-07-21 DIAGNOSIS — R109 Unspecified abdominal pain: Secondary | ICD-10-CM | POA: Insufficient documentation

## 2016-07-21 DIAGNOSIS — O2343 Unspecified infection of urinary tract in pregnancy, third trimester: Secondary | ICD-10-CM | POA: Diagnosis not present

## 2016-07-21 DIAGNOSIS — O99333 Smoking (tobacco) complicating pregnancy, third trimester: Secondary | ICD-10-CM | POA: Insufficient documentation

## 2016-07-21 DIAGNOSIS — N390 Urinary tract infection, site not specified: Secondary | ICD-10-CM | POA: Diagnosis present

## 2016-07-21 DIAGNOSIS — O26893 Other specified pregnancy related conditions, third trimester: Secondary | ICD-10-CM | POA: Diagnosis not present

## 2016-07-21 DIAGNOSIS — Z3A36 36 weeks gestation of pregnancy: Secondary | ICD-10-CM | POA: Diagnosis not present

## 2016-07-21 DIAGNOSIS — O26899 Other specified pregnancy related conditions, unspecified trimester: Secondary | ICD-10-CM | POA: Diagnosis present

## 2016-07-21 DIAGNOSIS — O2340 Unspecified infection of urinary tract in pregnancy, unspecified trimester: Secondary | ICD-10-CM | POA: Diagnosis not present

## 2016-07-21 LAB — URINALYSIS, COMPLETE (UACMP) WITH MICROSCOPIC
BILIRUBIN URINE: NEGATIVE
Glucose, UA: NEGATIVE mg/dL
HGB URINE DIPSTICK: NEGATIVE
Ketones, ur: 20 mg/dL — AB
NITRITE: POSITIVE — AB
PH: 6 (ref 5.0–8.0)
Protein, ur: NEGATIVE mg/dL
Specific Gravity, Urine: 1.014 (ref 1.005–1.030)

## 2016-07-21 LAB — GC/CHLAMYDIA PROBE AMP
CHLAMYDIA, DNA PROBE: NEGATIVE
Neisseria gonorrhoeae by PCR: NEGATIVE

## 2016-07-21 LAB — STREP GP B NAA: STREP GROUP B AG: NEGATIVE

## 2016-07-21 MED ORDER — NITROFURANTOIN MONOHYD MACRO 100 MG PO CAPS: 100.0000 mg | ORAL_CAPSULE | Freq: Two times a day (BID) | ORAL | 0 refills | Status: DC

## 2016-07-21 MED ORDER — ACETAMINOPHEN 325 MG PO TABS: 650.0000 mg | ORAL_TABLET | ORAL | Status: DC | PRN

## 2016-07-21 MED ORDER — NITROFURANTOIN MONOHYD MACRO 100 MG PO CAPS
100.0000 mg | ORAL_CAPSULE | Freq: Two times a day (BID) | ORAL | Status: DC
  Administered 2016-07-21: 100 mg via ORAL
  Filled 2016-07-21: qty 1

## 2016-07-21 MED ORDER — ONDANSETRON HCL 4 MG/2ML IJ SOLN: 4.0000 mg | Freq: Four times a day (QID) | INTRAMUSCULAR | Status: DC | PRN

## 2016-07-21 NOTE — OB Triage Note (Signed)
Patient presented to L&D complaining of pelvic and vaginal pressure that started around 1800 this evening.  Patient states pain is constant.  Denies any leaking of fluid, vaginal bleeding or decreased fetal movement.

## 2016-07-21 NOTE — Final Progress Note (Signed)
Physician Final Progress Note  Patient ID: Adrienne Blair MRN: [id number] DOB/AGE: 1990/08/17 27 y.o.  Admit date: 07/21/2016 Admitting provider: Nadara Mustard, MD Discharge date: 07/21/2016  Admission Diagnoses: Abdominal Pain  Discharge Diagnoses:  Active Problems:   Abdominal pain affecting pregnancy   UTI (urinary tract infection)  Consults: None  Significant Findings/ Diagnostic Studies: 26 yo G3P1011 at 82 4/[redacted] weeks EGA with lower vaginal and suptapubic pain tonight; no radiation, 4/10, no n/v/d/f/c/dysuria/VB/ROM/ctx's.  No modifiers.  Good FM. PMHx: She  has a past medical history of Anxiety; Depression; Frequent headaches; and UTI (urinary tract infection). Also,  has a past surgical history that includes Esophagogastroduodenoscopy., family history includes Cancer in her maternal grandfather; Diabetes in her mother, paternal grandfather, and paternal grandmother; Heart disease in her maternal grandmother; Hyperlipidemia in her mother; Hypertension in her maternal grandmother and mother.,  reports that she has been smoking Cigarettes.  She has been smoking about 0.50 packs per day. She has never used smokeless tobacco. She reports that she does not drink alcohol or use drugs.  She takes PNVs. Also, is allergic to gabapentin; ketorolac tromethamine; sulfamethoxazole; and ultram [tramadol].  Review of Systems  Constitutional: Negative for chills, fever and malaise/fatigue.  HENT: Negative for congestion, sinus pain and sore throat.   Eyes: Negative for blurred vision and pain.  Respiratory: Negative for cough and wheezing.   Cardiovascular: Negative for chest pain and leg swelling.  Gastrointestinal: Negative for abdominal pain, constipation, diarrhea, heartburn, nausea and vomiting.  Genitourinary: Negative for dysuria, frequency, hematuria and urgency.  Musculoskeletal: Negative for back pain, joint pain, myalgias and neck pain.  Skin: Negative for itching and rash.   Neurological: Negative for dizziness, tremors and weakness.  Endo/Heme/Allergies: Does not bruise/bleed easily.  Psychiatric/Behavioral: Negative for depression. The patient is not nervous/anxious and does not have insomnia.    Physical Exam  Constitutional: She is oriented to person, place, and time. She appears well-developed and well-nourished. No distress.  Genitourinary: Rectum normal and vagina normal. Pelvic exam was performed with patient supine. There is no rash or lesion on the right labia. There is no rash or lesion on the left labia. Vagina exhibits no lesion. No bleeding in the vagina.  Genitourinary Comments: CERVIX 1/TH/-3  Cardiovascular: Normal rate.   Pulmonary/Chest: Effort normal.  Abdominal: Soft. Bowel sounds are normal. She exhibits no distension. There is no tenderness. There is no rebound.  Musculoskeletal: Normal range of motion.  Neurological: She is alert and oriented to person, place, and time.  Skin: Skin is warm and dry.  Psychiatric: She has a normal mood and affect.  Vitals reviewed.  Results for orders placed or performed during the hospital encounter of 07/21/16  Urinalysis, Complete w Microscopic  Result Value Ref Range   Color, Urine YELLOW (A) YELLOW   APPearance HAZY (A) CLEAR   Specific Gravity, Urine 1.014 1.005 - 1.030   pH 6.0 5.0 - 8.0   Glucose, UA NEGATIVE NEGATIVE mg/dL   Hgb urine dipstick NEGATIVE NEGATIVE   Bilirubin Urine NEGATIVE NEGATIVE   Ketones, ur 20 (A) NEGATIVE mg/dL   Protein, ur NEGATIVE NEGATIVE mg/dL   Nitrite POSITIVE (A) NEGATIVE   Leukocytes, UA TRACE (A) NEGATIVE   RBC / HPF 0-5 0 - 5 RBC/hpf   WBC, UA 0-5 0 - 5 WBC/hpf   Bacteria, UA MANY (A) NONE SEEN   Squamous Epithelial / LPF 0-5 (A) NONE SEEN   Mucous PRESENT    A/P: UTI, Abd Pain at  36 weeks.  Plan Macrobid, rest, Tylenol, fluids.  Procedures: A NST procedure was performed with FHR monitoring and a normal baseline established, appropriate time of 20-40  minutes of evaluation, and accels >2 seen w 15x15 characteristics.  Results show a REACTIVE NST.   Discharge Condition: good  Disposition: 01-Home or Self Care  Diet: Regular diet  Discharge Activity: Activity as tolerated   Allergies as of 07/21/2016      Reactions   Gabapentin Swelling   Ketorolac Tromethamine    Sulfamethoxazole Diarrhea, Nausea And Vomiting   Ultram [tramadol] Nausea Only      Medication List    TAKE these medications   multivitamin-prenatal 27-0.8 MG Tabs tablet Take 1 tablet by mouth daily at 12 noon.   nitrofurantoin (macrocrystal-monohydrate) 100 MG capsule Commonly known as:  MACROBID Take 1 capsule (100 mg total) by mouth every 12 (twelve) hours.   terconazole 0.8 % vaginal cream Commonly known as:  TERAZOL 3 Place 1 applicator vaginally at bedtime. Can also apply externally 2-3 x/day prn vulvar irritation or itching        Total time spent taking care of this patient: 20 minutes  Signed: Letitia Libra 07/21/2016, 9:16 PM

## 2016-07-21 NOTE — Discharge Summary (Signed)
  See FPN 

## 2016-07-21 NOTE — Discharge Instructions (Signed)

## 2016-07-27 ENCOUNTER — Ambulatory Visit (INDEPENDENT_AMBULATORY_CARE_PROVIDER_SITE_OTHER): Payer: BLUE CROSS/BLUE SHIELD | Admitting: Certified Nurse Midwife

## 2016-07-27 VITALS — BP 108/68 | Wt 203.0 lb

## 2016-07-27 DIAGNOSIS — Z3493 Encounter for supervision of normal pregnancy, unspecified, third trimester: Secondary | ICD-10-CM

## 2016-07-27 DIAGNOSIS — Z3A37 37 weeks gestation of pregnancy: Secondary | ICD-10-CM

## 2016-07-27 DIAGNOSIS — Z3400 Encounter for supervision of normal first pregnancy, unspecified trimester: Secondary | ICD-10-CM

## 2016-07-27 NOTE — Progress Notes (Signed)
Getting uncomfortable-some sharp pelvic pains. Seen in L&D last week and treated for a UTI. Just finished Macrobid. GBS was negative. [facility] instructions given. Breast

## 2016-07-28 ENCOUNTER — Observation Stay
Admission: EM | Admit: 2016-07-28 | Discharge: 2016-07-28 | Disposition: A | Discharge status: Home / Self Care | Payer: BLUE CROSS/BLUE SHIELD | Attending: Obstetrics and Gynecology | Admitting: Obstetrics and Gynecology

## 2016-07-28 DIAGNOSIS — Z882 Allergy status to sulfonamides status: Secondary | ICD-10-CM | POA: Insufficient documentation

## 2016-07-28 DIAGNOSIS — Z888 Allergy status to other drugs, medicaments and biological substances status: Secondary | ICD-10-CM | POA: Insufficient documentation

## 2016-07-28 DIAGNOSIS — Z3A37 37 weeks gestation of pregnancy: Secondary | ICD-10-CM | POA: Diagnosis not present

## 2016-07-28 DIAGNOSIS — O368130 Decreased fetal movements, third trimester, not applicable or unspecified: Principal | ICD-10-CM | POA: Insufficient documentation

## 2016-07-28 DIAGNOSIS — O99333 Smoking (tobacco) complicating pregnancy, third trimester: Secondary | ICD-10-CM | POA: Diagnosis not present

## 2016-07-28 DIAGNOSIS — F17210 Nicotine dependence, cigarettes, uncomplicated: Secondary | ICD-10-CM | POA: Insufficient documentation

## 2016-07-28 NOTE — Discharge Summary (Signed)
Patient discharged with instructions on follow up appointment, labor precautions, fetal kick count, and when to seek medical attention.Patient ambulatory at discharge with steady gait and no complaints. Patient discharged with family.

## 2016-07-28 NOTE — Discharge Summary (Signed)
Physician Final Progress Note  Patient ID: Adrienne Blair MRN: [id number] DOB/AGE: 04/28/1990 26 y.o.  Admit date: 07/28/2016 Admitting provider: Tresea Mall, CNM Discharge date: 07/28/2016   Admission Diagnoses: decreased fetal movement, back pain  Discharge Diagnoses:  Active Problems:   Indication for care in labor and delivery, antepartum IUP at [redacted]w[redacted]d with reactive NST, not in labor  History of Present Illness: The patient is a 26 y.o. female G3P1011 at [redacted]w[redacted]d who presents for decreased fetal movement today and back pain that radiates into her legs since 6pm tonight. The patient tried tylenol with minimal relief. The pain occurs every 20 minutes and lasts for 5 minutes at a time. She denies LOF, VB, dysuria.    Past Medical History:  Diagnosis Date  . Anxiety   . Depression   . Frequent headaches   . UTI (urinary tract infection)     Past Surgical History:  Procedure Laterality Date  . ESOPHAGOGASTRODUODENOSCOPY      No current facility-administered medications on file prior to encounter.    Current Outpatient Prescriptions on File Prior to Encounter  Medication Sig Dispense Refill  . Prenatal Vit-Fe Fumarate-FA (MULTIVITAMIN-PRENATAL) 27-0.8 MG TABS tablet Take 1 tablet by mouth daily at 12 noon.    . nitrofurantoin, macrocrystal-monohydrate, (MACROBID) 100 MG capsule Take 1 capsule (100 mg total) by mouth every 12 (twelve) hours. (Patient not taking: Reported on 07/27/2016) 10 capsule 0  . terconazole (TERAZOL 3) 0.8 % vaginal cream Place 1 applicator vaginally at bedtime. Can also apply externally 2-3 x/day prn vulvar irritation or itching (Patient not taking: Reported on 07/21/2016) 20 g 0    Allergies  Allergen Reactions  . Gabapentin Swelling  . Ketorolac Tromethamine   . Sulfamethoxazole Diarrhea and Nausea And Vomiting  . Ultram [Tramadol] Nausea Only    Social History   Social History  . Marital status: Unknown    Spouse name: N/A  . Number of  children: N/A  . Years of education: N/A   Occupational History  . Not on file.   Social History Main Topics  . Smoking status: Current Every Day Smoker    Packs/day: 0.50    Types: Cigarettes  . Smokeless tobacco: Never Used  . Alcohol use No  . Drug use: No  . Sexual activity: Not Currently    Birth control/ protection: Implant   Other Topics Concern  . Not on file   Social History Narrative  . No narrative on file    Physical Exam: BP 127/86 (BP Location: Right Arm)   Pulse 89   Temp 97.9 F (36.6 C) (Oral)   Ht  (1.803 m)   Wt 205 lb (93 kg)   LMP 11/08/2015   BMI 28.59 kg/m   Gen: NAD CV: RRR Pulm: CTAB Pelvic: deferred Toco: negative Fetal Well Being: 140 bpm, moderate variability, +accels, -decels Ext: no evidence of DVT Urine appears dark yellow  Consults: None  Significant Findings/ Diagnostic Studies: none  Procedures: NST  Discharge Condition: good  Disposition: 01-Home or Self Care  Diet: Regular diet  Discharge Activity: Activity as tolerated  Comfort measures discussed for back pain of pregnancy, heat, hydrotherapy, support belt, stretching, tylenol PM if having difficulty sleeping  Discharge Instructions    Discharge activity:  No Restrictions    Complete by:  As directed    Discharge diet:  No restrictions    Complete by:  As directed    Fetal Kick Count:  Lie on our left side for  one hour after a meal, and count the number of times your baby kicks.  If it is less than 5 times, get up, move around and drink some juice.  Repeat the test 30 minutes later.  If it is still less than 5 kicks in an hour, notify your doctor.    Complete by:  As directed    LABOR:  When conractions begin, you should start to time them from the beginning of one contraction to the beginning  of the next.  When contractions are 5 - 10 minutes apart or less and have been regular for at least an hour, you should call your health care provider.    Complete by:   As directed    No sexual activity restrictions    Complete by:  As directed    Notify physician for bleeding from the vagina    Complete by:  As directed    Notify physician for blurring of vision or spots before the eyes    Complete by:  As directed    Notify physician for chills or fever    Complete by:  As directed    Notify physician for fainting spells, "black outs" or loss of consciousness    Complete by:  As directed    Notify physician for increase in vaginal discharge    Complete by:  As directed    Notify physician for leaking of fluid    Complete by:  As directed    Notify physician for pain or burning when urinating    Complete by:  As directed    Notify physician for pelvic pressure (sudden increase)    Complete by:  As directed    Notify physician for severe or continued nausea or vomiting    Complete by:  As directed    Notify physician for sudden gushing of fluid from the vagina (with or without continued leaking)    Complete by:  As directed    Notify physician for sudden, constant, or occasional abdominal pain    Complete by:  As directed    Notify physician if baby moving less than usual    Complete by:  As directed      Allergies as of 07/28/2016      Reactions   Gabapentin Swelling   Ketorolac Tromethamine    Sulfamethoxazole Diarrhea, Nausea And Vomiting   Ultram [tramadol] Nausea Only      Medication List    STOP taking these medications   nitrofurantoin (macrocrystal-monohydrate) 100 MG capsule Commonly known as:  MACROBID   terconazole 0.8 % vaginal cream Commonly known as:  TERAZOL 3     TAKE these medications   multivitamin-prenatal 27-0.8 MG Tabs tablet Take 1 tablet by mouth daily at 12 noon.      Follow-up Information    [facility] Follow up.   Why:  go to regular scheduled prenatal appointment Contact information: [address] [place] [address] [phone]          Total time spent  taking care of this patient: 15 minutes  Signed: Tresea Mall, CNM  07/28/2016, 8:54 PM

## 2016-07-28 NOTE — OB Triage Note (Signed)
Patient came in for observation for decreased fetal movement and lower back pain that started lastnight. Patient denies uterine contractions at this time. Patient denies leaking of fluid, vaginal bleeding and spotting. Vital signs stable and patient afebrile. FHR baseline 135 with moderate variability with accelerations 15 x 15 and no decelerations. Family at bedside. Will continue to monitor.

## 2016-08-04 ENCOUNTER — Ambulatory Visit (INDEPENDENT_AMBULATORY_CARE_PROVIDER_SITE_OTHER): Payer: BLUE CROSS/BLUE SHIELD | Admitting: Obstetrics & Gynecology

## 2016-08-04 VITALS — BP 100/60 | Wt 210.0 lb

## 2016-08-04 DIAGNOSIS — Z3A38 38 weeks gestation of pregnancy: Secondary | ICD-10-CM

## 2016-08-04 DIAGNOSIS — Z3400 Encounter for supervision of normal first pregnancy, unspecified trimester: Secondary | ICD-10-CM

## 2016-08-04 NOTE — Progress Notes (Signed)
PNV, FMC, Labor precautions, Nexplanon, Breast

## 2016-08-14 ENCOUNTER — Ambulatory Visit (INDEPENDENT_AMBULATORY_CARE_PROVIDER_SITE_OTHER): Payer: BLUE CROSS/BLUE SHIELD | Admitting: Obstetrics and Gynecology

## 2016-08-14 ENCOUNTER — Observation Stay
Admission: EM | Admit: 2016-08-14 | Discharge: 2016-08-15 | Disposition: A | Discharge status: Home / Self Care | Payer: BLUE CROSS/BLUE SHIELD | Attending: Certified Nurse Midwife | Admitting: Certified Nurse Midwife

## 2016-08-14 ENCOUNTER — Encounter: Payer: Self-pay | Admitting: *Deleted

## 2016-08-14 VITALS — BP 124/74 | Wt 205.0 lb

## 2016-08-14 DIAGNOSIS — O26893 Other specified pregnancy related conditions, third trimester: Principal | ICD-10-CM | POA: Diagnosis present

## 2016-08-14 DIAGNOSIS — Z3400 Encounter for supervision of normal first pregnancy, unspecified trimester: Secondary | ICD-10-CM

## 2016-08-14 DIAGNOSIS — R252 Cramp and spasm: Secondary | ICD-10-CM | POA: Diagnosis not present

## 2016-08-14 DIAGNOSIS — Z3A40 40 weeks gestation of pregnancy: Secondary | ICD-10-CM

## 2016-08-14 DIAGNOSIS — R109 Unspecified abdominal pain: Secondary | ICD-10-CM

## 2016-08-14 DIAGNOSIS — N898 Other specified noninflammatory disorders of vagina: Secondary | ICD-10-CM | POA: Diagnosis present

## 2016-08-14 DIAGNOSIS — O26899 Other specified pregnancy related conditions, unspecified trimester: Secondary | ICD-10-CM

## 2016-08-14 MED ORDER — ZOLPIDEM TARTRATE 5 MG PO TABS
ORAL_TABLET | ORAL | Status: AC
  Administered 2016-08-14: 5 mg via ORAL
  Filled 2016-08-14: qty 1

## 2016-08-14 MED ORDER — ZOLPIDEM TARTRATE 5 MG PO TABS
5.0000 mg | ORAL_TABLET | Freq: Once | ORAL | Status: AC
  Administered 2016-08-14: 5 mg via ORAL

## 2016-08-14 NOTE — Progress Notes (Signed)
Obstetric H&P   Chief Complaint: Postdates  Prenatal Care Provider: [facility]  History of Present Illness: 26 y.o. G3P1011 [redacted]w[redacted]d by 08/14/2016, presenting for postdates IOL.  +FM, no ctx, no LOF, no VB.  ABO, Rh: O/Negative/-- (01/04 0000)  Antibody: Negative (01/04 0000)  Rubella: Immune Varicella: Immune RPR: Nonreactive (01/04 0000)  HBsAg: Negative (01/04 0000)  HIV: Non-reactive (01/04 0000)  RPR: Nonreactive (01/04 0000) GBS: Negative (03/28 1201)   Review of Systems: 10 point review of systems negative unless otherwise noted in HPI  Past Medical History: Past Medical History:  Diagnosis Date  . Anxiety   . Depression   . Frequent headaches   . UTI (urinary tract infection)     Past Surgical History: Past Surgical History:  Procedure Laterality Date  . ESOPHAGOGASTRODUODENOSCOPY     Family History: Family History  Problem Relation Age of Onset  . Hyperlipidemia Mother   . Hypertension Mother   . Diabetes Mother   . Heart disease Maternal Grandmother   . Hypertension Maternal Grandmother   . Cancer Maternal Grandfather     lung  . Diabetes Paternal Grandmother   . Diabetes Paternal Grandfather     Social History: Social History   Social History  . Marital status: Unknown    Spouse name: N/A  . Number of children: N/A  . Years of education: N/A   Occupational History  . Not on file.   Social History Main Topics  . Smoking status: Current Every Day Smoker    Packs/day: 0.50    Types: Cigarettes  . Smokeless tobacco: Never Used  . Alcohol use No  . Drug use: No  . Sexual activity: Not Currently    Birth control/ protection: Implant   Other Topics Concern  . Not on file   Social History Narrative  . No narrative on file    Medications: Prior to Admission medications   Medication Sig Start Date End Date Taking? Authorizing Provider  Prenatal Vit-Fe Fumarate-FA (MULTIVITAMIN-PRENATAL) 27-0.8 MG TABS tablet Take 1 tablet by mouth daily at 12  noon.    Historical Provider, MD    Allergies: Allergies  Allergen Reactions  . Gabapentin Swelling  . Ketorolac Tromethamine   . Sulfamethoxazole Diarrhea and Nausea And Vomiting  . Ultram [Tramadol] Nausea Only    Physical Exam: Vitals: Blood pressure 124/74, weight 205 lb (93 kg), last menstrual period 11/08/2015.  FHT: 135  General: NAD HEENT: normocephalic, anicteric Pulmonary: No increased work of breathing Cardiovascular: RRR, distal pulses 2+ Abdomen: Gravid, non-tender Leopolds:vtx 8lbs Genitourinary: 3/70/-2 Extremities: no edema, erythema, or tenderness Neurologic: Grossly intact Psychiatric: mood appropriate, affect full  Labs: No results found for this or any previous visit (from the past 24 hour(s)).  Assessment: 26 y.o. G3P1011 [redacted]w[redacted]d by 08/14/2016,presenting for postdates IOL  Plan: 1) IOL - pitocin IOL  2) Fetus - continuous external fetal monitoring  3) GBS negative   4) Disposition - Pending delivery

## 2016-08-14 NOTE — Final Progress Note (Signed)
Physician Final Progress Note  Patient ID: Adrienne Blair MRN: [id number] DOB/AGE: 05-15-1990 26 y.o.  Admit date: 08/14/2016 Admitting provider: Nadara Mustard, MD/ Gasper Lloyd. Sharen Hones, CNM Discharge date: 08/14/2016   Admission Diagnoses: Cramping in pregnancy, third trimester IUP at 40 weeks  Discharge Diagnoses:  Same  Consults: None  Significant Findings/ Diagnostic Studies: 26 year old G3 P1011 with EDC=08/14/2016 presented at 40 weeks with cramping since her membranes were swept in the office today. She was 3cm 70% in the office today and her cervix had not changed. Non stress test was reactive and she was having some uterine irritability. Blood pressure was normal. She was discharged home with labor precautions and given 5 mgm Ambien for sleep. She is scheduled for an induction later this week.  Procedures: none  Discharge Condition: stable  Disposition: 01-Home or Self Care  Diet: Regular diet  Discharge Activity: Activity as tolerated  Discharge Instructions    Discharge patient    Complete by:  As directed    Discharge disposition:  01-Home or Self Care   Discharge patient date:  08/14/2016     Allergies as of 08/14/2016      Reactions   Gabapentin Swelling   Ketorolac Tromethamine    Sulfamethoxazole Diarrhea, Nausea And Vomiting   Ultram [tramadol] Nausea Only      Medication List    TAKE these medications   multivitamin-prenatal 27-0.8 MG Tabs tablet Take 1 tablet by mouth daily at 12 noon.        Total time spent taking care of this patient: 15 minutes  Signed: Farrel Conners 08/14/2016, 11:36 PM

## 2016-08-14 NOTE — Discharge Instructions (Signed)
Come back if: ° °Big gush of fluids °Temp over 100.4 °Heavy vaginal bleeding °Decreased fetal movement °Contractions every 3-5 min lasting at least one hour ° °Get plenty of rest and stay well hydrated! °

## 2016-08-14 NOTE — OB Triage Note (Signed)
Recvd pt from ED. Pt states she had an appt this morning and had her membranes sweeped. States she has been cramping all day. Feeling baby move ok. Rates pain an 8 out of 10.

## 2016-08-16 ENCOUNTER — Inpatient Hospital Stay: Payer: BLUE CROSS/BLUE SHIELD | Admitting: Certified Registered Nurse Anesthetist

## 2016-08-16 ENCOUNTER — Inpatient Hospital Stay
Admission: EM | Admit: 2016-08-16 | Discharge: 2016-08-17 | DRG: 775 | Disposition: A | Discharge status: Home / Self Care | Payer: BLUE CROSS/BLUE SHIELD | Attending: Obstetrics and Gynecology | Admitting: Obstetrics and Gynecology

## 2016-08-16 DIAGNOSIS — Z3493 Encounter for supervision of normal pregnancy, unspecified, third trimester: Secondary | ICD-10-CM | POA: Diagnosis not present

## 2016-08-16 DIAGNOSIS — Z833 Family history of diabetes mellitus: Secondary | ICD-10-CM

## 2016-08-16 DIAGNOSIS — F17210 Nicotine dependence, cigarettes, uncomplicated: Secondary | ICD-10-CM | POA: Diagnosis present

## 2016-08-16 DIAGNOSIS — Z8249 Family history of ischemic heart disease and other diseases of the circulatory system: Secondary | ICD-10-CM

## 2016-08-16 DIAGNOSIS — Z23 Encounter for immunization: Secondary | ICD-10-CM | POA: Diagnosis not present

## 2016-08-16 DIAGNOSIS — Z3A40 40 weeks gestation of pregnancy: Secondary | ICD-10-CM | POA: Diagnosis not present

## 2016-08-16 DIAGNOSIS — O99334 Smoking (tobacco) complicating childbirth: Secondary | ICD-10-CM | POA: Diagnosis not present

## 2016-08-16 LAB — CBC
HEMATOCRIT: 34.9 % — AB (ref 35.0–47.0)
HEMOGLOBIN: 12.1 g/dL (ref 12.0–16.0)
MCH: 29.8 pg (ref 26.0–34.0)
MCHC: 34.8 g/dL (ref 32.0–36.0)
MCV: 85.8 fL (ref 80.0–100.0)
Platelets: 211 10*3/uL (ref 150–440)
RBC: 4.07 MIL/uL (ref 3.80–5.20)
RDW: 13.5 % (ref 11.5–14.5)
WBC: 20.5 10*3/uL — AB (ref 3.6–11.0)

## 2016-08-16 LAB — TYPE AND SCREEN
ABO/RH(D): O NEG
ANTIBODY SCREEN: NEGATIVE

## 2016-08-16 MED ORDER — LACTATED RINGERS IV SOLN: 500.0000 mL | INTRAVENOUS | Status: DC | PRN

## 2016-08-16 MED ORDER — ONDANSETRON HCL 4 MG/2ML IJ SOLN: 4.0000 mg | Freq: Four times a day (QID) | INTRAMUSCULAR | Status: DC | PRN

## 2016-08-16 MED ORDER — BUPIVACAINE HCL (PF) 0.25 % IJ SOLN
INTRAMUSCULAR | Status: DC | PRN
Start: 2016-08-16 — End: 2016-08-16
  Administered 2016-08-16 (×2): 4 mL via EPIDURAL

## 2016-08-16 MED ORDER — LIDOCAINE HCL (PF) 1 % IJ SOLN
INTRAMUSCULAR | Status: DC | PRN
Start: 2016-08-16 — End: 2016-08-16
  Administered 2016-08-16: 3 mL

## 2016-08-16 MED ORDER — WITCH HAZEL-GLYCERIN EX PADS: 1.0000 "application " | MEDICATED_PAD | CUTANEOUS | Status: DC | PRN

## 2016-08-16 MED ORDER — SOD CITRATE-CITRIC ACID 500-334 MG/5ML PO SOLN
30.0000 mL | ORAL | Status: DC | PRN
  Filled 2016-08-16: qty 30

## 2016-08-16 MED ORDER — SODIUM CHLORIDE 0.9 % IV SOLN: 250.0000 mL | INTRAVENOUS | Status: DC | PRN

## 2016-08-16 MED ORDER — OXYCODONE-ACETAMINOPHEN 5-325 MG PO TABS
1.0000 | ORAL_TABLET | ORAL | Status: DC | PRN
Start: 2016-08-16 — End: 2016-08-17
  Administered 2016-08-16 – 2016-08-17 (×3): 1 via ORAL
  Filled 2016-08-16 (×5): qty 1

## 2016-08-16 MED ORDER — DIBUCAINE 1 % RE OINT: 1.0000 "application " | TOPICAL_OINTMENT | RECTAL | Status: DC | PRN

## 2016-08-16 MED ORDER — LIDOCAINE HCL (PF) 1 % IJ SOLN
INTRAMUSCULAR | Status: AC
  Filled 2016-08-16: qty 30

## 2016-08-16 MED ORDER — ONDANSETRON HCL 4 MG/2ML IJ SOLN: 4.0000 mg | INTRAMUSCULAR | Status: DC | PRN

## 2016-08-16 MED ORDER — BENZOCAINE-MENTHOL 20-0.5 % EX AERO: 1.0000 "application " | INHALATION_SPRAY | CUTANEOUS | Status: DC | PRN

## 2016-08-16 MED ORDER — FENTANYL 2.5 MCG/ML W/ROPIVACAINE 0.2% IN NS 100 ML EPIDURAL INFUSION (ARMC-ANES)
EPIDURAL | Status: AC
  Filled 2016-08-16: qty 100

## 2016-08-16 MED ORDER — NALOXONE HCL 2 MG/2ML IJ SOSY
1.0000 ug/kg/h | PREFILLED_SYRINGE | INTRAVENOUS | Status: DC | PRN
  Filled 2016-08-16: qty 2

## 2016-08-16 MED ORDER — SODIUM CHLORIDE 0.9% FLUSH: 3.0000 mL | INTRAVENOUS | Status: DC | PRN

## 2016-08-16 MED ORDER — OXYTOCIN 40 UNITS IN LACTATED RINGERS INFUSION - SIMPLE MED
INTRAVENOUS | Status: AC
  Administered 2016-08-16: 500 mL via INTRAVENOUS
  Filled 2016-08-16: qty 1000

## 2016-08-16 MED ORDER — AMMONIA AROMATIC IN INHA
RESPIRATORY_TRACT | Status: AC
  Filled 2016-08-16: qty 10

## 2016-08-16 MED ORDER — COCONUT OIL OIL
1.0000 "application " | TOPICAL_OIL | Status: DC | PRN
  Filled 2016-08-16: qty 120

## 2016-08-16 MED ORDER — OXYCODONE-ACETAMINOPHEN 5-325 MG PO TABS
2.0000 | ORAL_TABLET | ORAL | Status: DC | PRN
  Administered 2016-08-17 (×2): 2 via ORAL
  Filled 2016-08-16: qty 2

## 2016-08-16 MED ORDER — NALBUPHINE HCL 10 MG/ML IJ SOLN: 5.0000 mg | Freq: Once | INTRAMUSCULAR | Status: DC | PRN

## 2016-08-16 MED ORDER — ACETAMINOPHEN 325 MG PO TABS: 650.0000 mg | ORAL_TABLET | ORAL | Status: DC | PRN

## 2016-08-16 MED ORDER — LIDOCAINE HCL (PF) 1 % IJ SOLN: 30.0000 mL | INTRAMUSCULAR | Status: DC | PRN

## 2016-08-16 MED ORDER — DIPHENHYDRAMINE HCL 25 MG PO CAPS: 25.0000 mg | ORAL_CAPSULE | ORAL | Status: DC | PRN

## 2016-08-16 MED ORDER — NALOXONE HCL 0.4 MG/ML IJ SOLN: 0.4000 mg | INTRAMUSCULAR | Status: DC | PRN

## 2016-08-16 MED ORDER — IBUPROFEN 400 MG PO TABS
400.0000 mg | ORAL_TABLET | Freq: Four times a day (QID) | ORAL | Status: DC | PRN
  Administered 2016-08-16 – 2016-08-17 (×3): 400 mg via ORAL
  Filled 2016-08-16 (×4): qty 1

## 2016-08-16 MED ORDER — NALBUPHINE HCL 10 MG/ML IJ SOLN: 5.0000 mg | INTRAMUSCULAR | Status: DC | PRN

## 2016-08-16 MED ORDER — SENNOSIDES-DOCUSATE SODIUM 8.6-50 MG PO TABS
2.0000 | ORAL_TABLET | ORAL | Status: DC
  Administered 2016-08-17: 2 via ORAL
  Filled 2016-08-16: qty 2

## 2016-08-16 MED ORDER — LIDOCAINE-EPINEPHRINE (PF) 1.5 %-1:200000 IJ SOLN
INTRAMUSCULAR | Status: DC | PRN
Start: 2016-08-16 — End: 2016-08-16
  Administered 2016-08-16: 3 mL via PERINEURAL

## 2016-08-16 MED ORDER — LACTATED RINGERS IV SOLN
INTRAVENOUS | Status: DC
  Administered 2016-08-16 (×2): via INTRAVENOUS

## 2016-08-16 MED ORDER — SODIUM CHLORIDE 0.9% FLUSH: 3.0000 mL | Freq: Two times a day (BID) | INTRAVENOUS | Status: DC

## 2016-08-16 MED ORDER — DIPHENHYDRAMINE HCL 25 MG PO CAPS: 25.0000 mg | ORAL_CAPSULE | Freq: Four times a day (QID) | ORAL | Status: DC | PRN

## 2016-08-16 MED ORDER — MISOPROSTOL 200 MCG PO TABS
ORAL_TABLET | ORAL | Status: AC
  Filled 2016-08-16: qty 4

## 2016-08-16 MED ORDER — SIMETHICONE 80 MG PO CHEW: 80.0000 mg | CHEWABLE_TABLET | ORAL | Status: DC | PRN

## 2016-08-16 MED ORDER — FENTANYL 2.5 MCG/ML W/ROPIVACAINE 0.2% IN NS 100 ML EPIDURAL INFUSION (ARMC-ANES)
EPIDURAL | Status: DC | PRN
  Administered 2016-08-16: 12 mL/h via EPIDURAL

## 2016-08-16 MED ORDER — OXYTOCIN 10 UNIT/ML IJ SOLN: 10.0000 [IU] | Freq: Once | INTRAMUSCULAR | Status: DC

## 2016-08-16 MED ORDER — SENNOSIDES-DOCUSATE SODIUM 8.6-50 MG PO TABS
2.0000 | ORAL_TABLET | ORAL | Status: DC
Start: 2016-08-17 — End: 2016-08-16

## 2016-08-16 MED ORDER — OXYTOCIN 10 UNIT/ML IJ SOLN
INTRAMUSCULAR | Status: AC
  Filled 2016-08-16: qty 2

## 2016-08-16 MED ORDER — DIPHENHYDRAMINE HCL 50 MG/ML IJ SOLN: 12.5000 mg | INTRAMUSCULAR | Status: DC | PRN

## 2016-08-16 MED ORDER — FENTANYL 2.5 MCG/ML W/ROPIVACAINE 0.2% IN NS 100 ML EPIDURAL INFUSION (ARMC-ANES): 10.0000 mL/h | EPIDURAL | Status: DC

## 2016-08-16 MED ORDER — OXYTOCIN 40 UNITS IN LACTATED RINGERS INFUSION - SIMPLE MED
2.5000 [IU]/h | INTRAVENOUS | Status: DC
  Administered 2016-08-16: 2.5 [IU]/h via INTRAVENOUS

## 2016-08-16 MED ORDER — ZOLPIDEM TARTRATE 5 MG PO TABS: 5.0000 mg | ORAL_TABLET | Freq: Every evening | ORAL | Status: DC | PRN

## 2016-08-16 MED ORDER — ONDANSETRON HCL 4 MG PO TABS: 4.0000 mg | ORAL_TABLET | ORAL | Status: DC | PRN

## 2016-08-16 MED ORDER — ONDANSETRON HCL 4 MG/2ML IJ SOLN: 4.0000 mg | Freq: Three times a day (TID) | INTRAMUSCULAR | Status: DC | PRN

## 2016-08-16 MED ORDER — OXYTOCIN BOLUS FROM INFUSION
500.0000 mL | Freq: Once | INTRAVENOUS | Status: AC
Start: 2016-08-16 — End: 2016-08-16
  Administered 2016-08-16: 500 mL via INTRAVENOUS

## 2016-08-16 NOTE — Anesthesia Preprocedure Evaluation (Signed)
Anesthesia Evaluation  Patient identified by MRN, date of birth, ID band Patient awake    Reviewed: Allergy & Precautions, H&P , NPO status   Airway Mallampati: II  TM Distance: >3 FB Neck ROM: full    Dental  (+) Teeth Intact   Pulmonary Current Smoker,  smoker   Pulmonary exam normal        Cardiovascular Exercise Tolerance: Good negative cardio ROS Normal cardiovascular exam     Neuro/Psych    GI/Hepatic negative GI ROS, Neg liver ROS,   Endo/Other  negative endocrine ROS  Renal/GU negative Renal ROS     Musculoskeletal   Abdominal   Peds  Hematology negative hematology ROS (+)   Anesthesia Other Findings   Reproductive/Obstetrics (+) Pregnancy                             Anesthesia Physical Anesthesia Plan  ASA: II  Anesthesia Plan: Epidural   Post-op Pain Management:    Induction:   Airway Management Planned:   Additional Equipment:   Intra-op Plan:   Post-operative Plan:   Informed Consent: I have reviewed the patients History and Physical, chart, labs and discussed the procedure including the risks, benefits and alternatives for the proposed anesthesia with the patient or authorized representative who has indicated his/her understanding and acceptance.   Dental Advisory Given  Plan Discussed with: Anesthesiologist  Anesthesia Plan Comments:         Anesthesia Quick Evaluation

## 2016-08-16 NOTE — Progress Notes (Signed)
Pt arrived to BP laboring with bloody show and regular painful contractions since 9a yesterday morning, of and on asleep tonight, then woke up in a lot of pain around 1a. Has not been timing contractions, last seen in BP this past Monday, cervix 3cm. Confirms +FM, denies leaking of fluid. Says she thinks GBS was neg.

## 2016-08-16 NOTE — Discharge Summary (Signed)
OB Discharge Summary     Patient Name: Adrienne Blair DOB: 07-30-1990 MRN: [id number]  Date of admission: 08/16/2016 Delivering MD: Letitia Libra, MD  Date of Delivery: 08/17/2016  Date of discharge: 08/17/2016  Admitting diagnosis: 40 wks preg bleeding cramping Intrauterine pregnancy: [redacted]w[redacted]d     Secondary diagnosis: None     Discharge diagnosis: Term Pregnancy Delivered                                                                                                Post partum procedures:None  Augmentation: AROM  Complications: None  Hospital course:  Onset of Labor With Vaginal Delivery     26 y.o. yo G3P1011 at [redacted]w[redacted]d was admitted in Active Labor on 08/16/2016. Patient had an uncomplicated labor course as follows:  Membrane Rupture Time/Date: 8:16 AM ,08/16/2016   Intrapartum Procedures: Episiotomy: None [1]                                         Lacerations:    None Patient had a delivery of a Viable infant. 08/16/2016  Information for the patient's newborn:  Verlena, Marlette [[id number]]  Delivery Method: Vaginal, Spontaneous Delivery (Filed from Delivery Summary)  Delivery Note Primary OB: [facility] Delivery Physician: Annamarie Major, MD Gestational Age: Full term Antepartum complications: none Intrapartum complications: None  A viable Female was delivered via vertex perentation.  Apgars:9 ,9  Weight:  pending .   Placenta status: spontaneous and Intact.  Cord: 3+ vessels;  with the following complications: none.  Anesthesia:  epidural Episiotomy:  none Lacerations:  none Suture Repair: none Est. Blood Loss (mL):  200 mL  Mom to postpartum.  Baby to Couplet care / Skin to Skin.  Pateint had an uncomplicated postpartum course.  She is ambulating, tolerating a regular diet, passing flatus, and urinating well. Patient is discharged home in stable condition on 08/17/16.   Physical exam  Vitals:   08/16/16 1935 08/17/16 0038 08/17/16 0453 08/17/16 0700   BP: 110/74 109/68 101/62 (!) 102/52  Pulse: 68 (!) 56 (!) 58   Resp: Temp: 98.2 F (36.8 C) 97.7 F (36.5 C) 97.8 F (36.6 C) 97.9 F (36.6 C)  TempSrc: Oral Oral Oral Oral  SpO2: 100% 100% 100% 99%  Weight:      Height:       General: alert, cooperative and no distress Lochia: appropriate Uterine Fundus: firm Incision: N/A DVT Evaluation: No evidence of DVT seen on physical exam. Negative Homan's sign.  Labs: Lab Results  Component Value Date   WBC 13.1 (H) 08/17/2016   HGB 11.1 (L) 08/17/2016   HCT 32.7 (L) 08/17/2016   MCV 87.1 08/17/2016   PLT 196 08/17/2016   CMP Latest Ref Rng & Units 09/25/2011  Glucose 65 - 99 mg/dL 86  BUN 7 - 18 mg/dL 6(L)  Creatinine 7.82 - 1.30 mg/dL 9.56(O)  Sodium 130 - 865 mmol/L 139  Potassium 3.5 - 5.1 mmol/L 3.8  Chloride 98 -  107 mmol/L 107  CO2 21 - 32 mmol/L 22  Calcium 8.5 - 10.1 mg/dL 8.3(L)  Total Protein 6.4 - 8.2 g/dL 6.7  Total Bilirubin 0.2 - 1.0 mg/dL 0.4  Alkaline Phos 50 - 136 Unit/L 55  AST 15 - 37 Unit/L 11(L)  ALT U/L 16    Discharge instruction: per After Visit Summary.  Medications:  Allergies as of 08/17/2016      Reactions   Gabapentin Swelling   Ketorolac Tromethamine    Sulfamethoxazole Diarrhea, Nausea And Vomiting   Ultram [tramadol] Nausea Only      Medication List    TAKE these medications   multivitamin-prenatal 27-0.8 MG Tabs tablet Take 1 tablet by mouth daily at 12 noon.      Diet: routine diet  Activity: Advance as tolerated. Pelvic rest for 6 weeks.   Outpatient follow up: Follow-up Information    Letitia Libra, MD Follow up in 6 week(s).   Specialty:  Obstetrics and Gynecology Contact information: [address] [place] [address] [phone]             Postpartum contraception: Nexplanon Rhogam Given postpartum: no - Baby is B NEG Rubella vaccine given postpartum: no Varicella vaccine given postpartum: no TDaP given antepartum or  postpartum: yes  Newborn Data: Live born female  Birth Weight:   APGAR: 9, 9   Baby Feeding: Breast  Disposition:home with mother  SIGNED:  Letitia Libra, MD 08/17/2016 7:46 AM

## 2016-08-16 NOTE — H&P (Signed)
OB History & Physical   History of Present Illness:  Chief Complaint: contractions  HPI:  Adrienne Blair is a 26 y.o. G64P1011 female at [redacted]w[redacted]d dated by LMP c/w early ultrasound.  Her pregnancy has been uncomplicated.    She reports contractions since 1 AM today.   She denies leakage of fluid.   She reports vaginal bleeding (blood tinged mucous).   She reports fetal movement.    Maternal Medical History:   Past Medical History:  Diagnosis Date  . Anxiety   . Depression   . Frequent headaches   . UTI (urinary tract infection)     Past Surgical History:  Procedure Laterality Date  . ESOPHAGOGASTRODUODENOSCOPY      Allergies  Allergen Reactions  . Gabapentin Swelling  . Ketorolac Tromethamine   . Sulfamethoxazole Diarrhea and Nausea And Vomiting  . Ultram [Tramadol] Nausea Only      Medication Sig Start Date End Date Taking? Authorizing Provider  Prenatal Vit-Fe Fumarate-FA (MULTIVITAMIN-PRENATAL) 27-0.8 MG TABS tablet Take 1 tablet by mouth daily at 12 noon.   Yes Historical Provider, MD    OB History  Gravida Para Term Preterm AB Living  0 1 1  SAB TAB Ectopic Multiple Live Births  0 0 0 0 1    # Outcome Date GA Lbr Len/2nd Weight Sex Delivery Anes PTL Lv  3 Current           2 Term 03/11/12   6 lb 12 oz (3.062 kg) F Vag-Spont   LIV  1 AB               Prenatal care site: [facility]  Social History: She  reports that she has been smoking Cigarettes.  She has been smoking about 0.50 packs per day. She has never used smokeless tobacco. She reports that she does not drink alcohol or use drugs.  Family History: family history includes Cancer in her maternal grandfather; Diabetes in her mother, paternal grandfather, and paternal grandmother; Heart disease in her maternal grandmother; Hyperlipidemia in her mother; Hypertension in her maternal grandmother and mother.   Review of Systems: Negative x 10 systems reviewed except as noted in the HPI.     Physical Exam:  Vital Signs: BP 114/70 (BP Location: Left Arm)   Pulse 89   Temp 97.4 F (36.3 C) (Oral)   Resp 18   Ht  (1.803 m)   Wt 205 lb (93 kg)   LMP 11/08/2015   BMI 28.59 kg/m  Constitutional: Well nourished, well developed female in no acute distress.  HEENT: normal Skin: Warm and dry.  Cardiovascular: Regular rate and rhythm.   Extremity: no edema  Respiratory: Clear to auscultation bilateral. Normal respiratory effort Abdomen: gravid, tender with cx Back: no CVAT Neuro: DTRs 2+, Cranial nerves grossly intact Psych: Alert and Oriented x3. No memory deficits. Normal mood and affect.  MS: normal gait, normal bilateral lower extremity ROM/strength/stability.  Pelvic exam:  Cervix: 8cm per RN    Pertinent Results:  Prenatal Labs: Blood type/Rh O negative  Antibody screen negative  Rubella Immune  Varicella Immune    RPR NR  HBsAg negative  HIV negative  GC negative  Chlamydia negative  1 hour GTT passed  3 hour GTT N/a  GBS negative on 07/19/16   Baseline FHR: 145 beats/min   Variability: moderate   Accelerations: present   Decelerations: absent Contractions: present frequency: 3 q 10 min Overall assessment: category 1 (Negative CST)  Assessment:  Adrienne Blair is a 26 y.o. G33P1011 female at [redacted]w[redacted]d with active labor.   Plan:  1. Admit to Labor & Delivery  2. CBC, T&S, Clrs, IVF 3. GBS negative.   4. Fetwal well-being: reassuring 5. Expectant management   Thomasene Mohair, MD 08/16/2016 6:57 AM

## 2016-08-16 NOTE — Anesthesia Procedure Notes (Signed)
Epidural Patient location during procedure: OB Start time: 08/16/2016 7:19 AM End time: 08/16/2016 7:32 AM  Staffing Anesthesiologist: Priscella Mann AMY Resident/CRNA: Ginger Carne Performed: resident/CRNA   Preanesthetic Checklist Completed: patient identified, site marked, surgical consent, pre-op evaluation, timeout performed, IV checked, risks and benefits discussed and monitors and equipment checked  Epidural Patient position: sitting Prep: Betadine Patient monitoring: heart rate, continuous pulse ox and blood pressure Approach: midline Location: L4-L5 Injection technique: LOR saline  Needle:  Needle type: Tuohy  Needle gauge: 17 G Needle length: 9 cm and 9 Needle insertion depth: 8 cm Catheter type: closed end flexible Catheter size: 19 Gauge Catheter at skin depth: 14 cm Test dose: negative and 1.5% lidocaine with Epi 1:200 K  Assessment Sensory level: T10 Events: blood not aspirated, injection not painful, no injection resistance, negative IV test and no paresthesia  Additional Notes Pt. Evaluated and documentation done after procedure finished. Patient identified. Risks/Benefits/Options discussed with patient including but not limited to bleeding, infection, nerve damage, paralysis, failed block, incomplete pain control, headache, blood pressure changes, nausea, vomiting, reactions to medication both or allergic, itching and postpartum back pain. Confirmed with bedside nurse the patient's most recent platelet count. Confirmed with patient that they are not currently taking any anticoagulation, have any bleeding history or any family history of bleeding disorders. Patient expressed understanding and wished to proceed. All questions were answered. Sterile technique was used throughout the entire procedure. Please see nursing notes for vital signs. Test dose was given through epidural catheter and negative prior to continuing to dose epidural or start infusion. Warning  signs of high block given to the patient including shortness of breath, tingling/numbness in hands, complete motor block, or any concerning symptoms with instructions to call for help. Patient was given instructions on fall risk and not to get out of bed. All questions and concerns addressed with instructions to call with any issues or inadequate analgesia.   Patient tolerated the insertion well without immediate complications.Reason for block:procedure for pain

## 2016-08-16 NOTE — Progress Notes (Signed)
  Labor Progress Note   26 y.o. [id number] @ [redacted]w[redacted]d , admitted for  Pregnancy, Labor Management.   Subjective:  No pain after epidural Pt had presented w moderate painful ctxs and cervix at 8 cm.   No VB or SROM.  Objective:  BP 105/65   Pulse 95   Temp 98.3 F (36.8 C) (Oral)   Resp 18   Ht  (1.803 m)   Wt 205 lb (93 kg)   LMP 11/08/2015   SpO2 98%   BMI 28.59 kg/m  Abd: gravid Vtx Extr: trace to 1+ bilateral pedal edema SVE: CERVIX: 9 cm dilated, 90 effaced, 0 station, presenting part Vtx  EFM: FHR: 130 bpm, variability: moderate,  accelerations:  + ,  decelerations:  Absent Toco: Frequency: Every 2-3 minutes Labs: I have reviewed the patient's lab results.   Assessment & Plan:  [id number] @ [redacted]w[redacted]d, admitted for  Pregnancy and Labor/Delivery Management  1. Pain management: epidural. 2. FWB: FHT category 1.  3. ID: GBS negative 4. Labor management: AROM thin mec Anticpate 2nd satge soon  All discussed with patient, see orders  Annamarie Major, MD, [facility], [facility] 08/16/2016  8:22 AM

## 2016-08-17 LAB — CBC
HCT: 32.7 % — ABNORMAL LOW (ref 35.0–47.0)
Hemoglobin: 11.1 g/dL — ABNORMAL LOW (ref 12.0–16.0)
MCH: 29.4 pg (ref 26.0–34.0)
MCHC: 33.8 g/dL (ref 32.0–36.0)
MCV: 87.1 fL (ref 80.0–100.0)
Platelets: 196 10*3/uL (ref 150–440)
RBC: 3.76 MIL/uL — AB (ref 3.80–5.20)
RDW: 13.8 % (ref 11.5–14.5)
WBC: 13.1 10*3/uL — AB (ref 3.6–11.0)

## 2016-08-17 LAB — RPR: RPR: NONREACTIVE

## 2016-08-17 NOTE — Progress Notes (Signed)
All discharge instructions given to patient and she voices understanding of all instructions given. She will make her own f/u appt. No scripts given , motrin over the counter if needed. Patient discharged home with Mother escorted out by volunteers in wheelchair holding infant

## 2016-08-17 NOTE — Anesthesia Postprocedure Evaluation (Signed)
Anesthesia Post Note  Patient: Adrienne Blair  Procedure(s) Performed: * No procedures listed *  Patient location during evaluation: Mother Baby Anesthesia Type: Epidural Level of consciousness: awake, awake and alert and oriented Pain management: pain level controlled Vital Signs Assessment: post-procedure vital signs reviewed and stable Respiratory status: spontaneous breathing Cardiovascular status: blood pressure returned to baseline Postop Assessment: no headache, no backache, no signs of nausea or vomiting and adequate PO intake Anesthetic complications: no     Last Vitals:  Vitals:   08/17/16 0038 08/17/16 0453  BP: 109/68 101/62  Pulse: (!) 56 (!) 58  Resp: 18 18  Temp: 36.5 C 36.6 C    Last Pain:  Vitals:   08/17/16 0454  TempSrc:   PainSc: 6                  Lula Michaux Lawerance Cruel

## 2016-08-17 NOTE — Progress Notes (Signed)
Admit Date: 08/16/2016 Today's Date: 08/17/2016  Post Partum Day 1  Subjective:  no complaints, up ad lib, voiding and tolerating PO  Objective: Temp:  [97.7 F (36.5 C)-98.3 F (36.8 C)] 97.9 F (36.6 C) (04/26 0700) Pulse Rate:  [56-115] 58 (04/26 0453) Resp:  [14-20] 20 (04/26 0700) BP: (95-120)/(50-91) 102/52 (04/26 0700) SpO2:  [98 %-100 %] 99 % (04/26 0700)  Physical Exam:  General: alert, cooperative and no distress Lochia: appropriate Uterine Fundus: firm Incision: none DVT Evaluation: No evidence of DVT seen on physical exam. Negative Homan's sign.   Recent Labs  08/16/16 0539 08/17/16 0613  HGB 12.1 11.1*  HCT 34.9* 32.7*    Assessment/Plan: Discharge home, Breastfeeding, Contraception (plans Nexplanon) and Infant doing well   LOS: 1 day   Letitia Libra [facility] 08/17/2016, 7:47 AM

## 2016-08-17 NOTE — Discharge Instructions (Signed)
Follow up sooner with fever, problems breathing, pain not helped by medications, severe depression( more than just baby blues, wanting to hurt yourself or the baby), severe bleeding ( saturating more than one pad an hour or large palm sized clots), no heavy lifting , no driving while taking narcotics, no douches, intercourse, tampons or enemas for 6 weeks Vaginal Delivery, Care After °Refer to this sheet in the next few weeks. These instructions provide you with information about caring for yourself after vaginal delivery. Your health care provider may also give you more specific instructions. Your treatment has been planned according to current medical practices, but problems sometimes occur. Call your health care provider if you have any problems or questions. °What can I expect after the procedure? °After vaginal delivery, it is common to have: °· Some bleeding from your vagina. °· Soreness in your abdomen, your vagina, and the area of skin between your vaginal opening and your anus (perineum). °· Pelvic cramps. °· Fatigue. °Follow these instructions at home: °Medicines  °· Take over-the-counter and prescription medicines only as told by your health care provider. °· If you were prescribed an antibiotic medicine, take it as told by your health care provider. Do not stop taking the antibiotic until it is finished. °Driving  ° °· Do not drive or operate heavy machinery while taking prescription pain medicine. °· Do not drive for 24 hours if you received a sedative. °Lifestyle  °· Do not drink alcohol. This is especially important if you are breastfeeding or taking medicine to relieve pain. °· Do not use tobacco products, including cigarettes, chewing tobacco, or e-cigarettes. If you need help quitting, ask your health care provider. °Eating and drinking  °· Drink at least 8 eight-ounce glasses of water every day unless you are told not to by your health care provider. If you choose to breastfeed your baby, you may  need to drink more water than this. °· Eat high-fiber foods every day. These foods may help prevent or relieve constipation. High-fiber foods include: °¨ Whole grain cereals and breads. °¨ Brown rice. °¨ Beans. °¨ Fresh fruits and vegetables. °Activity  °· Return to your normal activities as told by your health care provider. Ask your health care provider what activities are safe for you. °· Rest as much as possible. Try to rest or take a nap when your baby is sleeping. °· Do not lift anything that is heavier than your baby or 10 lb (4.5 kg) until your health care provider says that it is safe. °· Talk with your health care provider about when you can engage in sexual activity. This may depend on your: °¨ Risk of infection. °¨ Rate of healing. °¨ Comfort and desire to engage in sexual activity. °Vaginal Care  °· If you have an episiotomy or a vaginal tear, check the area every day for signs of infection. Check for: °¨ More redness, swelling, or pain. °¨ More fluid or blood. °¨ Warmth. °¨ Pus or a bad smell. °· Do not use tampons or douches until your health care provider says this is safe. °· Watch for any blood clots that may pass from your vagina. These may look like clumps of dark red, brown, or black discharge. °General instructions  °· Keep your perineum clean and dry as told by your health care provider. °· Wear loose, comfortable clothing. °· Wipe from front to back when you use the toilet. °· Ask your health care provider if you can shower or take a bath.   If you had an episiotomy or a perineal tear during labor and delivery, your health care provider may tell you not to take baths for a certain length of time. °· Wear a bra that supports your breasts and fits you well. °· If possible, have someone help you with household activities and help care for your baby for at least a few days after you leave the hospital. °· Keep all follow-up visits for you and your baby as told by your health care provider. This is  important. °Contact a health care provider if: °· You have: °¨ Vaginal discharge that has a bad smell. °¨ Difficulty urinating. °¨ Pain when urinating. °¨ A sudden increase or decrease in the frequency of your bowel movements. °¨ More redness, swelling, or pain around your episiotomy or vaginal tear. °¨ More fluid or blood coming from your episiotomy or vaginal tear. °¨ Pus or a bad smell coming from your episiotomy or vaginal tear. °¨ A fever. °¨ A rash. °¨ Little or no interest in activities you used to enjoy. °¨ Questions about caring for yourself or your baby. °· Your episiotomy or vaginal tear feels warm to the touch. °· Your episiotomy or vaginal tear is separating or does not appear to be healing. °· Your breasts are painful, hard, or turn red. °· You feel unusually sad or worried. °· You feel nauseous or you vomit. °· You pass large blood clots from your vagina. If you pass a blood clot from your vagina, save it to show to your health care provider. Do not flush blood clots down the toilet without having your health care provider look at them. °· You urinate more than usual. °· You are dizzy or light-headed. °· You have not breastfed at all and you have not had a menstrual period for 12 weeks after delivery. °· You have stopped breastfeeding and you have not had a menstrual period for 12 weeks after you stopped breastfeeding. °Get help right away if: °· You have: °¨ Pain that does not go away or does not get better with medicine. °¨ Chest pain. °¨ Difficulty breathing. °¨ Blurred vision or spots in your vision. °¨ Thoughts about hurting yourself or your baby. °· You develop pain in your abdomen or in one of your legs. °· You develop a severe headache. °· You faint. °· You bleed from your vagina so much that you fill two sanitary pads in one hour. °This information is not intended to replace advice given to you by your health care provider. Make sure you discuss any questions you have with your health care  provider. °Document Released: 04/07/2000 Document Revised: 09/22/2015 Document Reviewed: 04/25/2015 °Elsevier Interactive Patient Education © 2017 Elsevier Inc. ° °

## 2016-08-22 DIAGNOSIS — Z3482 Encounter for supervision of other normal pregnancy, second trimester: Secondary | ICD-10-CM | POA: Diagnosis not present

## 2016-08-22 DIAGNOSIS — Z3483 Encounter for supervision of other normal pregnancy, third trimester: Secondary | ICD-10-CM | POA: Diagnosis not present

## 2016-09-20 ENCOUNTER — Ambulatory Visit (INDEPENDENT_AMBULATORY_CARE_PROVIDER_SITE_OTHER): Payer: BLUE CROSS/BLUE SHIELD | Admitting: Obstetrics & Gynecology

## 2016-09-20 VITALS — BP 120/80 | Wt 185.0 lb

## 2016-09-20 DIAGNOSIS — Z30017 Encounter for initial prescription of implantable subdermal contraceptive: Secondary | ICD-10-CM

## 2016-09-20 NOTE — Patient Instructions (Signed)
Etonogestrel implant What is this medicine? ETONOGESTREL (et oh noe JES trel) is a contraceptive (birth control) device. It is used to prevent pregnancy. It can be used for up to 3 years. This medicine may be used for other purposes; ask your health care provider or pharmacist if you have questions. COMMON BRAND NAME(S): Implanon, Nexplanon What should I tell my health care provider before I take this medicine? They need to know if you have any of these conditions: -abnormal vaginal bleeding -blood vessel disease or blood clots -cancer of the breast, cervix, or liver -depression -diabetes -gallbladder disease -headaches -heart disease or recent heart attack -high blood pressure -high cholesterol -kidney disease -liver disease -renal disease -seizures -tobacco smoker -an unusual or allergic reaction to etonogestrel, other hormones, anesthetics or antiseptics, medicines, foods, dyes, or preservatives -pregnant or trying to get pregnant -breast-feeding How should I use this medicine? This device is inserted just under the skin on the inner side of your upper arm by a health care professional. Talk to your pediatrician regarding the use of this medicine in children. Special care may be needed. Overdosage: If you think you have taken too much of this medicine contact a poison control center or emergency room at once. NOTE: This medicine is only for you. Do not share this medicine with others. What if I miss a dose? This does not apply. What may interact with this medicine? Do not take this medicine with any of the following medications: -amprenavir -bosentan -fosamprenavir This medicine may also interact with the following medications: -barbiturate medicines for inducing sleep or treating seizures -certain medicines for fungal infections like ketoconazole and itraconazole -grapefruit juice -griseofulvin -medicines to treat seizures like carbamazepine, felbamate, oxcarbazepine,  phenytoin, topiramate -modafinil -phenylbutazone -rifampin -rufinamide -some medicines to treat HIV infection like atazanavir, indinavir, lopinavir, nelfinavir, tipranavir, ritonavir -St. John's wort This list may not describe all possible interactions. Give your health care provider a list of all the medicines, herbs, non-prescription drugs, or dietary supplements you use. Also tell them if you smoke, drink alcohol, or use illegal drugs. Some items may interact with your medicine. What should I watch for while using this medicine? This product does not protect you against HIV infection (AIDS) or other sexually transmitted diseases. You should be able to feel the implant by pressing your fingertips over the skin where it was inserted. Contact your doctor if you cannot feel the implant, and use a non-hormonal birth control method (such as condoms) until your doctor confirms that the implant is in place. If you feel that the implant may have broken or become bent while in your arm, contact your healthcare provider. What side effects may I notice from receiving this medicine? Side effects that you should report to your doctor or health care professional as soon as possible: -allergic reactions like skin rash, itching or hives, swelling of the face, lips, or tongue -breast lumps -changes in emotions or moods -depressed mood -heavy or prolonged menstrual bleeding -pain, irritation, swelling, or bruising at the insertion site -scar at site of insertion -signs of infection at the insertion site such as fever, and skin redness, pain or discharge -signs of pregnancy -signs and symptoms of a blood clot such as breathing problems; changes in vision; chest pain; severe, sudden headache; pain, swelling, warmth in the leg; trouble speaking; sudden numbness or weakness of the face, arm or leg -signs and symptoms of liver injury like dark yellow or brown urine; general ill feeling or flu-like symptoms;  light-colored   stools; loss of appetite; nausea; right upper belly pain; unusually weak or tired; yellowing of the eyes or skin -unusual vaginal bleeding, discharge -signs and symptoms of a stroke like changes in vision; confusion; trouble speaking or understanding; severe headaches; sudden numbness or weakness of the face, arm or leg; trouble walking; dizziness; loss of balance or coordination Side effects that usually do not require medical attention (report to your doctor or health care professional if they continue or are bothersome): -acne -back pain -breast pain -changes in weight -dizziness -general ill feeling or flu-like symptoms -headache -irregular menstrual bleeding -nausea -sore throat -vaginal irritation or inflammation This list may not describe all possible side effects. Call your doctor for medical advice about side effects. You may report side effects to FDA at 1-800-FDA-1088. Where should I keep my medicine? This drug is given in a hospital or clinic and will not be stored at home. NOTE: This sheet is a summary. It may not cover all possible information. If you have questions about this medicine, talk to your doctor, pharmacist, or health care provider.  2018 Elsevier/Gold Standard (2015-10-28 11:19:22)  

## 2016-09-20 NOTE — Progress Notes (Signed)
Subjective:     Adrienne Blair is a 26 y.o. female who presents for a postpartum visit. She is 6 weeks postpartum following a spontaneous vaginal delivery. I have fully reviewed the prenatal and intrapartum course. [facility] gestational weeks. Outcome: spontaneous vaginal delivery. Anesthesia: epidural. Postpartum course has been routine. Baby's course has been good. Baby is feeding by bottle - -. Bleeding no bleeding and period began yesterday. Bowel function is normal. Bladder function is normal. Patient is not sexually active. Contraception method is Nexplanon. Postpartum depression screening: negative.  The following portions of the patient's history were reviewed and updated as appropriate: allergies, current medications, past family history, past medical history, past social history, past surgical history and problem list.  Review of Systems Pertinent items noted in HPI and remainder of comprehensive ROS otherwise negative.   Objective:    BP 120/80   Wt 185 lb (83.9 kg)   LMP 11/08/2015   BMI 25.80 kg/m   General:  alert and cooperative   Breasts:  inspection negative, no nipple discharge or bleeding, no masses or nodularity palpable  Lungs: clear to auscultation bilaterally  Heart:  regular rate and rhythm, S1, S2 normal, no murmur, click, rub or gallop  Abdomen: soft, non-tender; bowel sounds normal; no masses,  no organomegaly   Vulva:  normal  Vagina: normal vagina  Cervix:  no bleeding following Pap and no lesions  Corpus: normal size, contour, position, consistency, mobility, non-tender  Adnexa:  no mass, fullness, tenderness  Rectal Exam: Not performed.        Assessment:     Normal postpartum exam. Pap smear due 04/2017.  Desires contraception- Nexplanon Plan:    1. Contraception: Nexplanon 2. Monitor cycles 3. Follow up in: 8 months or as needed.    Nexplanon Insertion  Patient given informed consent, signed copy in the chart, time  out was performed. Appropriate time out taken.  Patient's left arm was prepped and draped in the usual sterile fashion.. The ruler used to measure and mark insertion area.  Pt was prepped with betadine swab and then injected with 3 cc of 2% lidocaine with epinephrine. Nexplanon removed form packaging,  Device confirmed in needle, then inserted full length of needle and withdrawn per handbook instructions.  Pt insertion site covered with steri-strip and a bandage.   Minimal blood loss.  Pt tolerated the procedure well.    Annamarie MajorPaul Meaghan Whistler, MD, Merlinda FrederickFACOG [facility], [facility] 09/20/2016  10:36 AM

## 2016-11-29 ENCOUNTER — Ambulatory Visit (INDEPENDENT_AMBULATORY_CARE_PROVIDER_SITE_OTHER): Payer: BLUE CROSS/BLUE SHIELD | Admitting: Advanced Practice Midwife

## 2016-11-29 ENCOUNTER — Encounter: Payer: Self-pay | Admitting: Advanced Practice Midwife

## 2016-11-29 VITALS — BP 118/74 | Ht 71.0 in | Wt 180.0 lb

## 2016-11-29 DIAGNOSIS — Z30011 Encounter for initial prescription of contraceptive pills: Secondary | ICD-10-CM

## 2016-11-29 DIAGNOSIS — Z3046 Encounter for surveillance of implantable subdermal contraceptive: Secondary | ICD-10-CM | POA: Diagnosis not present

## 2016-11-29 MED ORDER — NORETHINDRONE 0.35 MG PO TABS: 1.0000 | ORAL_TABLET | Freq: Every day | ORAL | 11 refills | Status: DC

## 2016-11-29 NOTE — Progress Notes (Signed)
     GYNECOLOGY PROCEDURE NOTE  The patient has had continuous bleeding since the Nexplanon was inserted in May. She prefers to have it removed and use pills instead. She is a smoker and states she is trying to quit. She is informed that her choice of pill is the progesterone-only pill due to risk factors of estrogen and tobacco use. She understands the importance of taking the pill at the same time every day for maximum effectiveness.   Nexplanon removal discussed in detail.  Risks of infection, bleeding, nerve injury all reviewed.  Patient understands risks and desires to proceed.  Verbal consent obtained.  Patient is certain she wants the Nexplanon removed.  All questions answered.  Procedure: Patient placed in dorsal supine with left arm above head, elbow flexed at 90 degrees, arm resting on examination table.  Nexplanon identified without problems.  Betadine scrub x2.  1 ml of 1% lidocaine injected under Nexplanon device without problems.  Sterile gloves applied.  Small 0.5cm incision made at distal tip of Nexplanon device with 11 blade scalpel.  Nexplanon brought to incision and grasped with a small kelly clamp.  Nexplanon removed intact without problems.  Pressure applied to incision.  Hemostasis obtained.  Steri-strips applied, followed by bandage and compression dressing.  Patient tolerated procedure well.  No complications.   Assessment: 26 y.o. year old female now s/p uncomplicated Nexplanon removal.  Plan: 1.  Patient given post procedure precautions and asked to call for fever, chills, redness or drainage from her incision, bleeding from incision.  She understands she will likely have a small bruise near site of removal and can remove bandage tomorrow and steri-strips in approximately 1 week.  2) Contraception: progesterone-only pill  Tresea MallJane Raygan Skarda, CNM

## 2017-01-25 ENCOUNTER — Encounter: Payer: Self-pay | Admitting: Advanced Practice Midwife

## 2017-01-25 ENCOUNTER — Ambulatory Visit (INDEPENDENT_AMBULATORY_CARE_PROVIDER_SITE_OTHER): Payer: BLUE CROSS/BLUE SHIELD | Admitting: Advanced Practice Midwife

## 2017-01-25 VITALS — BP 118/76 | Ht 71.0 in | Wt 180.0 lb

## 2017-01-25 DIAGNOSIS — N921 Excessive and frequent menstruation with irregular cycle: Secondary | ICD-10-CM

## 2017-01-25 DIAGNOSIS — Z30011 Encounter for initial prescription of contraceptive pills: Secondary | ICD-10-CM

## 2017-01-25 MED ORDER — NORGESTIMATE-ETH ESTRADIOL 0.25-35 MG-MCG PO TABS: 1.0000 | ORAL_TABLET | Freq: Every day | ORAL | 11 refills | Status: DC

## 2017-01-25 NOTE — Progress Notes (Signed)
S: The patient is here today for medication follow up. She has been taking progesterone-only birth control pills for 2 months and is starting on her 3rd month. She states her bleeding has decreased generally and she had a 2 week period of no bleeding in the past month. She also admits increased stress in her life with the injury and death of her grandmother and a diagnosis of Stage 3 lung cancer in her father. She quit smoking when she learned of her father's cancer. Discussion of how stress can affect hormones. She agrees but would like to try a combined hormone pill now that she is no longer smoking.   O: Vital Signs: BP 118/76   Ht  (1.803 m)   Wt 180 lb (81.6 kg)   BMI 25.10 kg/m  Constitutional: Well nourished, well developed female in no acute distress.  HEENT: normal Skin: Warm and dry.  Cardiovascular: Regular rate and rhythm.   Respiratory: Clear to auscultation bilateral. Normal respiratory effort Psych: Alert and Oriented x3. No memory deficits. Normal mood and affect.  MS: normal gait, normal bilateral lower extremity ROM/strength/stability.  A: 26 yo female with follow up for medication management related to menorrhagia  P: Switch pill from progesterone only to combined pill F/U as needed  Tresea Mall, CNM

## 2017-03-06 DIAGNOSIS — J069 Acute upper respiratory infection, unspecified: Secondary | ICD-10-CM | POA: Diagnosis not present

## 2017-12-15 ENCOUNTER — Other Ambulatory Visit: Payer: Self-pay | Admitting: Advanced Practice Midwife

## 2017-12-15 DIAGNOSIS — Z30011 Encounter for initial prescription of contraceptive pills: Secondary | ICD-10-CM

## 2017-12-15 DIAGNOSIS — N921 Excessive and frequent menstruation with irregular cycle: Secondary | ICD-10-CM

## 2018-01-24 ENCOUNTER — Ambulatory Visit: Payer: BLUE CROSS/BLUE SHIELD | Admitting: Obstetrics & Gynecology

## 2018-01-30 ENCOUNTER — Telehealth: Payer: Self-pay | Admitting: Obstetrics & Gynecology

## 2018-01-30 NOTE — Telephone Encounter (Signed)
Called and left voicemail for patient to call back to be schedule. Mychart message sent. °

## 2018-01-30 NOTE — Telephone Encounter (Signed)
-----   Message from Nadara Mustard, MD sent at 01/24/2018  8:39 AM EDT ----- Regarding: annual Plz reschedule Annual (she missed today), as she is need of PAP and discussion

## 2018-01-31 NOTE — Telephone Encounter (Signed)
Called and left voice mail for patient to call back to be schedule °

## 2018-02-07 ENCOUNTER — Other Ambulatory Visit: Payer: Self-pay | Admitting: Advanced Practice Midwife

## 2018-02-07 DIAGNOSIS — Z30011 Encounter for initial prescription of contraceptive pills: Secondary | ICD-10-CM

## 2018-02-07 DIAGNOSIS — N921 Excessive and frequent menstruation with irregular cycle: Secondary | ICD-10-CM

## 2018-02-26 ENCOUNTER — Ambulatory Visit (INDEPENDENT_AMBULATORY_CARE_PROVIDER_SITE_OTHER): Payer: Medicaid Other | Admitting: Obstetrics & Gynecology

## 2018-02-26 ENCOUNTER — Other Ambulatory Visit (HOSPITAL_COMMUNITY)
Admission: RE | Admit: 2018-02-26 | Discharge: 2018-02-26 | Disposition: A | Discharge status: Home / Self Care | Payer: BLUE CROSS/BLUE SHIELD | Source: Ambulatory Visit | Attending: Obstetrics & Gynecology | Admitting: Obstetrics & Gynecology

## 2018-02-26 ENCOUNTER — Encounter: Payer: Self-pay | Admitting: Obstetrics & Gynecology

## 2018-02-26 VITALS — BP 110/70 | Ht 71.0 in | Wt 173.0 lb

## 2018-02-26 DIAGNOSIS — Z1321 Encounter for screening for nutritional disorder: Secondary | ICD-10-CM

## 2018-02-26 DIAGNOSIS — Z0000 Encounter for general adult medical examination without abnormal findings: Secondary | ICD-10-CM | POA: Diagnosis not present

## 2018-02-26 DIAGNOSIS — Z131 Encounter for screening for diabetes mellitus: Secondary | ICD-10-CM

## 2018-02-26 DIAGNOSIS — Z13220 Encounter for screening for lipoid disorders: Secondary | ICD-10-CM

## 2018-02-26 DIAGNOSIS — Z124 Encounter for screening for malignant neoplasm of cervix: Secondary | ICD-10-CM | POA: Insufficient documentation

## 2018-02-26 DIAGNOSIS — Z1329 Encounter for screening for other suspected endocrine disorder: Secondary | ICD-10-CM

## 2018-02-26 DIAGNOSIS — Z3041 Encounter for surveillance of contraceptive pills: Secondary | ICD-10-CM

## 2018-02-26 MED ORDER — NORGESTIMATE-ETH ESTRADIOL 0.25-35 MG-MCG PO TABS: 1.0000 | ORAL_TABLET | Freq: Every day | ORAL | 3 refills | Status: DC

## 2018-02-26 NOTE — Patient Instructions (Signed)
PAP every year Labs every 3 years

## 2018-02-26 NOTE — Progress Notes (Signed)
HPI:      Adrienne Blair is a 27 y.o. [id number] who LMP was Adrienne Blair's last menstrual period was 02/22/2018., Adrienne Blair presents today for Adrienne Blair annual examination. Adrienne Blair has no complaints today. Adrienne Blair is sexually active. Adrienne Blair last pap: approximate date 2014 and was normal. Adrienne Blair does perform self breast exams.  There is no notable family history of breast or ovarian cancer in Adrienne Blair family.  Adrienne Blair has regular exercise: yesThe Adrienne Blair denies current symptoms of depression.    GYN History: Contraception: OCP (estrogen/progesterone)     Did not like Nexplanon, BTB  PMHx: Past Medical History:  Diagnosis Date  . Anxiety   . Depression   . Frequent headaches   . UTI (urinary tract infection)    Past Surgical History:  Procedure Laterality Date  . ESOPHAGOGASTRODUODENOSCOPY     Family History  Problem Relation Age of Onset  . Hyperlipidemia Mother   . Hypertension Mother   . Diabetes Mother   . Heart disease Maternal Grandmother   . Hypertension Maternal Grandmother   . Cancer Maternal Grandfather        lung  . Diabetes Paternal Grandmother   . Diabetes Paternal Grandfather    Social History   Tobacco Use  . Smoking status: Current Every Day Smoker    Packs/day: 0.50    Types: Cigarettes  . Smokeless tobacco: Never Used  Substance Use Topics  . Alcohol use: No    Alcohol/week: 0.0 standard drinks  . Drug use: No    Current Outpatient Medications:  .  norgestimate-ethinyl estradiol (MILI) 0.25-35 MG-MCG tablet, Take 1 tablet by mouth daily., Disp: 84 tablet, Rfl: 3 .  Prenatal Vit-Fe Fumarate-FA (MULTIVITAMIN-PRENATAL) 27-0.8 MG TABS tablet, Take 1 tablet by mouth daily at 12 noon., Disp: , Rfl:  Allergies: Gabapentin; Ketorolac tromethamine; Sulfamethoxazole; and Ultram [tramadol]  Review of Systems  Constitutional: Negative for chills, fever and malaise/fatigue.  HENT: Negative for congestion, sinus pain and sore throat.   Eyes: Negative for  blurred vision and pain.  Respiratory: Negative for cough and wheezing.   Cardiovascular: Negative for chest pain and leg swelling.  Gastrointestinal: Negative for abdominal pain, constipation, diarrhea, heartburn, nausea and vomiting.  Genitourinary: Negative for dysuria, frequency, hematuria and urgency.  Musculoskeletal: Negative for back pain, joint pain, myalgias and neck pain.  Skin: Negative for itching and rash.  Neurological: Negative for dizziness, tremors and weakness.  Endo/Heme/Allergies: Does not bruise/bleed easily.  Psychiatric/Behavioral: Negative for depression. Adrienne Blair is not nervous/anxious and does not have insomnia.    Objective: BP 110/70   Ht 5\' 11"  (1.803 m)   Wt 173 lb (78.5 kg)   LMP 02/22/2018   BMI 24.13 kg/m   Filed Weights   02/26/18 1012  Weight: 173 lb (78.5 kg)   Body mass index is 24.13 kg/m. Physical Exam  Constitutional: Adrienne Blair is oriented to person, place, and time. Adrienne Blair appears well-developed and well-nourished. No distress.  Genitourinary: Rectum normal, vagina normal and uterus normal. Pelvic exam was performed with Adrienne Blair supine. There is no rash or lesion on Adrienne right labia. There is no rash or lesion on Adrienne left labia. Vagina exhibits no lesion. No bleeding in Adrienne vagina. Right adnexum does not display mass and does not display tenderness. Left adnexum does not display mass and does not display tenderness. Cervix does not exhibit motion tenderness, lesion, friability or polyp.   Uterus is mobile and midaxial. Uterus is not enlarged or exhibiting a mass.  HENT:  Head: Normocephalic and atraumatic. Head is without laceration.  Right Ear: Hearing normal.  Left Ear: Hearing normal.  Nose: No epistaxis.  No foreign bodies.  Mouth/Throat: Uvula is midline, oropharynx is clear and moist and mucous membranes are normal.  Eyes: Pupils are equal, round, and reactive to light.  Neck: Normal range of motion. Neck supple. No thyromegaly present.    Cardiovascular: Normal rate and regular rhythm. Exam reveals no gallop and no friction rub.  No murmur heard. Pulmonary/Chest: Effort normal and breath sounds normal. No respiratory distress. Adrienne Blair has no wheezes. Right breast exhibits no mass, no skin change and no tenderness. Left breast exhibits no mass, no skin change and no tenderness.  Abdominal: Soft. Bowel sounds are normal. Adrienne Blair exhibits no distension. There is no tenderness. There is no rebound.  Musculoskeletal: Normal range of motion.  Neurological: Adrienne Blair is alert and oriented to person, place, and time. No cranial nerve deficit.  Skin: Skin is warm and dry.  Psychiatric: Adrienne Blair has a normal mood and affect. Judgment normal.  Vitals reviewed.  Assessment: 1. Annual physical exam   2. Screening for cervical cancer   3. Encounter for surveillance of contraceptive pills   4. Screening for diabetes mellitus   5. Screening for thyroid disorder   6. Encounter for vitamin deficiency screening   7. Screening for cholesterol level    Screening Plan:            1.  Cervical Screening-  Pap smear done today  2. Labs Ordered today  3. Counseling for contraception: oral contraceptives (estrogen/progesterone)    F/U  Return in about 1 year (around 02/27/2019) for Annual.  Annamarie Major, MD, [facility], [facility] 02/26/2018  10:29 AM

## 2018-02-27 LAB — LIPID PANEL
CHOL/HDL RATIO: 4 ratio (ref 0.0–4.4)
CHOLESTEROL TOTAL: 187 mg/dL (ref 100–199)
HDL: 47 mg/dL (ref >39)
LDL Calculated: 126 mg/dL — ABNORMAL HIGH (ref 0–99)
TRIGLYCERIDES: 70 mg/dL (ref 0–149)
VLDL Cholesterol Cal: 14 mg/dL (ref 5–40)

## 2018-02-27 LAB — HEMOGLOBIN A1C
ESTIMATED AVERAGE GLUCOSE: 97 mg/dL
Hgb A1c MFr Bld: 5 % (ref 4.8–5.6)

## 2018-02-27 LAB — TSH: TSH: 0.962 u[IU]/mL (ref 0.450–4.500)

## 2018-02-27 LAB — VITAMIN D 25 HYDROXY (VIT D DEFICIENCY, FRACTURES): Vit D, 25-Hydroxy: 38.7 ng/mL (ref 30.0–100.0)

## 2018-02-28 LAB — CYTOLOGY - PAP

## 2018-03-07 ENCOUNTER — Ambulatory Visit (INDEPENDENT_AMBULATORY_CARE_PROVIDER_SITE_OTHER): Payer: BLUE CROSS/BLUE SHIELD | Admitting: Obstetrics & Gynecology

## 2018-03-07 ENCOUNTER — Encounter: Payer: Self-pay | Admitting: Obstetrics & Gynecology

## 2018-03-07 VITALS — BP 120/80 | Ht 71.0 in | Wt 175.0 lb

## 2018-03-07 DIAGNOSIS — R300 Dysuria: Secondary | ICD-10-CM | POA: Diagnosis not present

## 2018-03-07 DIAGNOSIS — N3000 Acute cystitis without hematuria: Secondary | ICD-10-CM

## 2018-03-07 LAB — POCT URINALYSIS DIPSTICK
BILIRUBIN UA: NEGATIVE
Blood, UA: NEGATIVE
GLUCOSE UA: NEGATIVE
Ketones, UA: NEGATIVE
Nitrite, UA: NEGATIVE
PH UA: 5 (ref 5.0–8.0)
PROTEIN UA: NEGATIVE
Spec Grav, UA: 1.02 (ref 1.010–1.025)
Urobilinogen, UA: 0.2 E.U./dL

## 2018-03-07 MED ORDER — CEFIXIME 400 MG PO CAPS: 400.0000 mg | ORAL_CAPSULE | Freq: Every day | ORAL | 0 refills | Status: AC

## 2018-03-07 MED ORDER — FLAVOXATE HCL 100 MG PO TABS: 100.0000 mg | ORAL_TABLET | Freq: Three times a day (TID) | ORAL | 1 refills | Status: DC | PRN

## 2018-03-07 NOTE — Patient Instructions (Signed)
Cefixime oral tablets or capsules What is this medicine? CEFIXIME (sef IX eem) is a cephalosporin antibiotic. It is used to treat certain kinds of bacterial infections. It will not work for colds, flu, or other viral infections. This medicine may be used for other purposes; ask your health care provider or pharmacist if you have questions. COMMON BRAND NAME(S): Suprax What should I tell my health care provider before I take this medicine? They need to know if you have any of these conditions: -bleeding problems -kidney disease -stomach or intestine problems (especially colitis) -an unusual or allergic reaction to cefixime, other cephalosporin or penicillin antibiotics, other foods, dyes or preservatives -pregnant or trying to get pregnant -breast-feeding How should I use this medicine? Take this medicine by mouth with a glass of water. Follow the directions on the prescription label. You can take it with or without food. If it upsets your stomach, take it with food. Take your medicine at regular intervals. Do not take it more often than directed. Take all of your medicine as directed even if you think your are better. Do not skip doses or stop your medicine early. Talk to your pediatrician regarding the use of this medicine in children. While this drug may be prescribed for children as young as 6 months for selected conditions, precautions do apply. Overdosage: If you think you have taken too much of this medicine contact a poison control center or emergency room at once. NOTE: This medicine is only for you. Do not share this medicine with others. What if I miss a dose? If you miss a dose, take it as soon as you can. If it is almost time for your next dose, take only that dose. Do not take double or extra doses. What may interact with this medicine? -aspirin and aspirin-like medicines -carbamazepine -medicines that treat or prevent blood clots like warfarin This list may not describe all  possible interactions. Give your health care provider a list of all the medicines, herbs, non-prescription drugs, or dietary supplements you use. Also tell them if you smoke, drink alcohol, or use illegal drugs. Some items may interact with your medicine. What should I watch for while using this medicine? Tell your doctor or health care professional if your symptoms do not improve or if you get new symptoms. Your doctor will monitor your condition and blood work as needed. Do not treat diarrhea with over the counter products. Contact your doctor if you have diarrhea that lasts more than 2 days or if it is severe and watery. This medicine can interfere with some urine glucose and some urine ketone tests. If you use such tests, talk with your health care professional. If you are being treated for a sexually transmitted disease, avoid sexual contact until you have finished your treatment. Having sex can infect your sexual partner. What side effects may I notice from receiving this medicine? Side effects that you should report to your doctor or health care professional as soon as possible: -allergic reactions like skin rash, itching or hives, swelling of the face, lips, or tongue -bloody or watery diarrhea -difficulty breathing or wheezing -dizziness -fever -pain or trouble passing urine or change in the amount of urine -redness, blistering, peeling or loosening of the skin, including inside the mouth -seizures -unusual bleeding or bruising -unusually weak or tired -yellowing of the eyes or skin Side effects that usually do not require medical attention (report to your doctor or health care professional if they continue or are bothersome): -  diarrhea -headache -genital or anal irritation -loss of appetite -nausea, vomiting -stomach pain, upset, or gas This list may not describe all possible side effects. Call your doctor for medical advice about side effects. You may report side effects to FDA  at 1-800-FDA-1088. Where should I keep my medicine? Keep out of the reach of children. Store at room temperature between 20 and 25 degrees C (68 and 77 degrees F). Throw away any unused medicine after the expiration date. NOTE: This sheet is a summary. It may not cover all possible information. If you have questions about this medicine, talk to your doctor, pharmacist, or health care provider.  2018 Elsevier/Gold Standard (2007-07-29 16:31:02)

## 2018-03-07 NOTE — Progress Notes (Signed)
  HPI:      [name] is a 27 y.o. [id number] who LMP was Patient's last menstrual period was 02/22/2018., presents today for a problem visit.    Urinary Tract Infection: Patient complains of burning with urination . She has had symptoms for 2 days. Patient also complains of no other concerns. Patient denies back pain and fever. Patient does not have a history of recurrent UTI.  Patient does not have a history of pyelonephritis.   PMHx: She  has a past medical history of Anxiety, Depression, Frequent headaches, and UTI (urinary tract infection). Also,  has a past surgical history that includes Esophagogastroduodenoscopy., family history includes Cancer in her maternal grandfather; Diabetes in her mother, paternal grandfather, and paternal grandmother; Heart disease in her maternal grandmother; Hyperlipidemia in her mother; Hypertension in her maternal grandmother and mother.,  reports that she has been smoking cigarettes. She has been smoking about 0.50 packs per day. She has never used smokeless tobacco. She reports that she does not drink alcohol or use drugs.  She has a current medication list which includes the following prescription(s): norgestimate-ethinyl estradiol, cefixime, flavoxate, and multivitamin-prenatal. Also, is allergic to gabapentin; ketorolac tromethamine; sulfamethoxazole; and ultram [tramadol].  Review of Systems  All other systems reviewed and are negative.   Objective: BP 120/80   Ht 5\' 11"  (1.803 m)   Wt 175 lb (79.4 kg)   LMP 02/22/2018   BMI 24.41 kg/m  Physical Exam  Constitutional: She is oriented to person, place, and time. She appears well-developed and well-nourished. No distress.  Musculoskeletal: Normal range of motion.  Neurological: She is alert and oriented to person, place, and time.  Skin: Skin is warm and dry.  Psychiatric: She has a normal mood and affect.  Vitals reviewed. Back: No CVAT or flank T  ASSESSMENT/PLAN:   Acute  cystitis  Problem List Items Addressed This Visit      Genitourinary   UTI (urinary tract infection)   Relevant Medications   cefixime (SUPRAX) 400 MG CAPS capsule   Other Relevant Orders   Urine Culture    Other Visit Diagnoses    Dysuria    -  Primary   Relevant Orders   POCT urinalysis dipstick (Completed)    Suprax and Urispas Rx F/u PRN  Annamarie MajorPaul Merary Garguilo, MD, [facility], [facility] 03/07/2018  12:02 PM

## 2018-03-09 LAB — URINE CULTURE

## 2018-04-07 DIAGNOSIS — M25572 Pain in left ankle and joints of left foot: Secondary | ICD-10-CM | POA: Diagnosis not present

## 2018-04-07 DIAGNOSIS — F17210 Nicotine dependence, cigarettes, uncomplicated: Secondary | ICD-10-CM | POA: Diagnosis not present

## 2018-04-07 DIAGNOSIS — S93402A Sprain of unspecified ligament of left ankle, initial encounter: Secondary | ICD-10-CM | POA: Diagnosis not present

## 2018-04-07 DIAGNOSIS — S99912A Unspecified injury of left ankle, initial encounter: Secondary | ICD-10-CM | POA: Diagnosis not present

## 2018-04-07 DIAGNOSIS — W010XXA Fall on same level from slipping, tripping and stumbling without subsequent striking against object, initial encounter: Secondary | ICD-10-CM | POA: Diagnosis not present

## 2018-05-27 DIAGNOSIS — R112 Nausea with vomiting, unspecified: Secondary | ICD-10-CM | POA: Diagnosis not present

## 2018-05-27 DIAGNOSIS — J029 Acute pharyngitis, unspecified: Secondary | ICD-10-CM | POA: Diagnosis not present

## 2018-05-27 DIAGNOSIS — R6889 Other general symptoms and signs: Secondary | ICD-10-CM | POA: Diagnosis not present

## 2018-05-27 DIAGNOSIS — R1084 Generalized abdominal pain: Secondary | ICD-10-CM | POA: Diagnosis not present

## 2019-01-09 ENCOUNTER — Telehealth: Payer: Self-pay | Admitting: Obstetrics & Gynecology

## 2019-01-09 ENCOUNTER — Other Ambulatory Visit: Payer: Self-pay | Admitting: Obstetrics & Gynecology

## 2019-01-09 DIAGNOSIS — Z3041 Encounter for surveillance of contraceptive pills: Secondary | ICD-10-CM

## 2019-01-09 MED ORDER — NORGESTIMATE-ETH ESTRADIOL 0.25-35 MG-MCG PO TABS: 1.0000 | ORAL_TABLET | Freq: Every day | ORAL | 0 refills | Status: DC

## 2019-01-09 NOTE — Telephone Encounter (Signed)
Schedule ANNUAL in [facility]

## 2019-01-09 NOTE — Telephone Encounter (Signed)
Called and spoke with patient to get her schedule for November. Patient states [place] out of [place].

## 2019-01-09 NOTE — Telephone Encounter (Signed)
Please advise 

## 2019-04-07 ENCOUNTER — Telehealth: Payer: Self-pay | Admitting: Obstetrics & Gynecology

## 2019-04-07 DIAGNOSIS — Z3041 Encounter for surveillance of contraceptive pills: Secondary | ICD-10-CM

## 2019-04-07 NOTE — Telephone Encounter (Signed)
Sch annual.  Refill done

## 2019-04-08 NOTE — Telephone Encounter (Signed)
Called and left voice mail for patient to call back to be schedule °

## 2019-04-30 ENCOUNTER — Telehealth: Payer: Self-pay

## 2019-04-30 NOTE — Telephone Encounter (Signed)
Patient states she moved to [place], but is stuck in [place] til the end of the month. Requesting refill of OCP. She had apt 05/09/19 in [place] but is here til 20th and had to cancel. She's here taking care of her father who isn't doing well. She understands we don't take out of state Medicaid and is inquiring how much a self pay visit would be for AE so she can get a refill of her birth control. AU#[phone]

## 2019-05-01 NOTE — Telephone Encounter (Signed)
Patient aware of payment of 132.75 for appointment and is schedule 05/08/19 with ABC

## 2019-05-02 ENCOUNTER — Other Ambulatory Visit: Payer: Self-pay

## 2019-05-02 ENCOUNTER — Emergency Department
Admission: EM | Admit: 2019-05-02 | Discharge: 2019-05-02 | Disposition: A | Discharge status: Home / Self Care | Payer: Medicaid - Out of State | Attending: Emergency Medicine | Admitting: Emergency Medicine

## 2019-05-02 ENCOUNTER — Encounter: Payer: Self-pay | Admitting: Emergency Medicine

## 2019-05-02 DIAGNOSIS — F17210 Nicotine dependence, cigarettes, uncomplicated: Secondary | ICD-10-CM | POA: Insufficient documentation

## 2019-05-02 DIAGNOSIS — L739 Follicular disorder, unspecified: Secondary | ICD-10-CM | POA: Diagnosis not present

## 2019-05-02 DIAGNOSIS — L02512 Cutaneous abscess of left hand: Secondary | ICD-10-CM | POA: Diagnosis present

## 2019-05-02 DIAGNOSIS — Z79899 Other long term (current) drug therapy: Secondary | ICD-10-CM | POA: Insufficient documentation

## 2019-05-02 MED ORDER — CLINDAMYCIN HCL 150 MG PO CAPS: ORAL_CAPSULE | ORAL | 0 refills | Status: AC

## 2019-05-02 NOTE — ED Notes (Signed)
See triage note  Presents with possible abscess areas to left groin area  Also possible to left index finger  Afebrile on arrival

## 2019-05-02 NOTE — ED Provider Notes (Signed)
[facility] Emergency Department Provider Note   ____________________________________________   First MD Initiated Contact with Patient 05/02/19 [phone]     (approximate)  I have reviewed the triage vital signs and the nursing notes.   HISTORY  Chief Complaint Abscess   HPI Adrienne Blair is a 29 y.o. female presents to the ED with possible abscess to the groin area as well as her left index finger.  Patient states that it is possible that she has some fiberglass splinters in her left index finger.  Patient states that she works around [employer] in [place].  She has been picking at these areas with a small amount of drainage.  Patient denies any previous abscesses.  She denies any fever or chills.     Past Medical History:  Diagnosis Date  . Anxiety   . Depression   . Frequent headaches   . UTI (urinary tract infection)     Patient Active Problem List   Diagnosis Date Noted  . Encounter for surveillance of contraceptive pills 02/26/2018  . Normal labor 08/16/2016  . Indication for care in labor and delivery, antepartum 07/28/2016  . UTI (urinary tract infection) 07/21/2016  . Encounter for supervision of low-risk first pregnancy, antepartum 07/12/2016  . PFS (patellofemoral syndrome) 12/01/2014  . Preventative health care 11/24/2014  . Pain, joint, ankle 11/24/2014  . Anxiety and depression 11/24/2014    Past Surgical History:  Procedure Laterality Date  . ESOPHAGOGASTRODUODENOSCOPY      Prior to Admission medications   Medication Sig Start Date End Date Taking? Authorizing Provider  clindamycin (CLEOCIN) 150 MG capsule 2 tablets tid until finished 05/02/19   Letitia Neri L, PA-C  flavoxATE (URISPAS) 100 MG tablet Take 1 tablet (100 mg total) by mouth 3 (three) times daily as needed for bladder spasms (bladder spasm). 03/07/18   Gae Dry, MD  MILI 0.25-35 MG-MCG tablet TAKE 1 TABLET BY MOUTH DAILY 04/07/19   Gae Dry,  MD  Prenatal Vit-Fe Fumarate-FA (MULTIVITAMIN-PRENATAL) 27-0.8 MG TABS tablet Take 1 tablet by mouth daily at 12 noon.    [provider]    Allergies Gabapentin, Ketorolac tromethamine, Sulfamethoxazole, and Ultram [tramadol]  Family History  Problem Relation Age of Onset  . Hyperlipidemia Mother   . Hypertension Mother   . Diabetes Mother   . Heart disease Maternal Grandmother   . Hypertension Maternal Grandmother   . Cancer Maternal Grandfather        lung  . Diabetes Paternal Grandmother   . Diabetes Paternal Grandfather     Social History Social History   Tobacco Use  . Smoking status: Current Every Day Smoker    Packs/day: 0.50    Types: Cigarettes  . Smokeless tobacco: Never Used  Substance Use Topics  . Alcohol use: No    Alcohol/week: 0.0 standard drinks  . Drug use: No    Review of Systems Constitutional: No fever/chills Cardiovascular: Denies chest pain. Respiratory: Denies shortness of breath. Gastrointestinal: No abdominal pain.  No nausea, no vomiting.  Musculoskeletal: Negative for back pain. Skin: Questionable abscess left index finger and groin area. Neurological: Negative for headaches, focal weakness or numbness. ___________________________________________   PHYSICAL EXAM:  VITAL SIGNS: ED Triage Vitals  Enc Vitals Group     BP 05/02/19 0906 129/86     Pulse Rate 05/02/19 0906 91     Resp 05/02/19 0906 18     Temp 05/02/19 0906 98.2 F (36.8 C)     Temp  Source 05/02/19 0906 Oral     SpO2 05/02/19 0906 100 %     Weight 05/02/19 0904 210 lb (95.3 kg)     Height 05/02/19 0904 5\' 11"  (1.803 m)     Head Circumference --      Peak Flow --      Pain Score 05/02/19 0903 5     Pain Loc --      Pain Edu? --      Excl. in GC? --     Constitutional: Alert and oriented. Well appearing and in no acute distress. Eyes: Conjunctivae are normal.  Head: Atraumatic. Neck: No stridor.   Cardiovascular: Normal rate, regular rhythm. Grossly  normal heart sounds.  Good peripheral circulation. Respiratory: Normal respiratory effort.  No retractions. Lungs CTAB. Gastrointestinal: Soft and nontender. No distention.  Musculoskeletal: On examination of the left index finger there is no gross deformity however there is some superficial erythema noted on the lateral aspect.  Patient is able to flex and extend without any difficulty.  No warmth or drainage is noted.  There are 3 areas that are superficial that appears to have been picked at.  Capillary refill is less than 3 seconds. Neurologic:  Normal speech and language. No gross focal neurologic deficits are appreciated. No gait instability. Skin:  Skin is warm, dry and intact.  There are multiple erythematous papules noted at the base of hair follicles across the mons pubis area.  Patient does shave with a razor in this area.  No areas that are fluctuant in order to I&D at this time.  Areas are tender to touch.  They are very firm and without warmth at this time. Psychiatric: Mood and affect are normal. Speech and behavior are normal.  ____________________________________________   LABS (all labs ordered are listed, but only abnormal results are displayed)  Labs Reviewed - No data to display  PROCEDURES  Procedure(s) performed (including Critical Care):  Procedures  ____________________________________________   INITIAL IMPRESSION / ASSESSMENT AND PLAN / ED COURSE  As part of my medical decision making, I reviewed the following data within the electronic MEDICAL RECORD NUMBER Notes from prior ED visits and [place] Controlled Substance Database  Adrienne Blair was evaluated in Emergency Department on 05/02/2019 for the symptoms described in the history of present illness. She was evaluated in the context of the global COVID-19 pandemic, which necessitated consideration that the patient might be at risk for infection with the SARS-CoV-2 virus that causes COVID-19. Institutional protocols  and algorithms that pertain to the evaluation of patients at risk for COVID-19 are in a state of rapid change based on information released by regulatory bodies including the CDC and federal and state organizations. These policies and algorithms were followed during the patient's care in the ED.  29 year old female presents to the ED with complaint of possible abscess to her left index finger and in the genital region.  It is possible that patient got some fiberglass in her left index finger at work.  There is no drainage in the area.  She has multiple areas of folliculitis in the mons pubis area but no localized abscess is noted.  Patient was started on antibiotics and told to use warm compresses to the area.  She also will discard her razor and not shave until these areas have completely cleared and then use a new razor.  She is to follow-up with her PCP if any continued problems or return to the emergency department if any severe worsening  of her symptoms.   ____________________________________________   FINAL CLINICAL IMPRESSION(S) / ED DIAGNOSES  Final diagnoses:  Folliculitis     ED Discharge Orders         Ordered    clindamycin (CLEOCIN) 150 MG capsule     05/02/19 0958           Note:  This document was prepared using Dragon voice recognition software and may include unintentional dictation errors.    Tommi Rumps, PA-C 05/02/19 1338    Chesley Noon, MD 05/04/19 2204

## 2019-05-02 NOTE — Discharge Instructions (Signed)
Follow-up with 1 the clinics listed on your discharge papers.  Begin taking antibiotics as directed.  You also need to obtain a probiotic to take with this to avoid any GI issues.  Use warm compresses to the areas that are swollen and painful.  You may also take a sitz bath which will do the same thing.  Do not shave in that area and let everything heal.  Also throw away that razor and get a new one.  Return to the emergency department if any severe worsening of your symptoms.

## 2019-05-02 NOTE — ED Triage Notes (Signed)
Pt presents to ED via POV with c/o abscess to L groin and to L index finger.

## 2019-05-07 NOTE — Progress Notes (Signed)
PCP:  Patient, No Pcp Per   Chief Complaint  Patient presents with  . Gynecologic Exam     HPI:      [name] is a 29 y.o. [id number] who LMP was Patient's last menstrual period was 04/14/2019 (approximate)., presents today for her annual examination.  Her menses are regular every 28-30 days, lasting 3-7 days on OCPs.  Dysmenorrhea none to mild She has occas intermenstrual bleeding without late/missed pills.  Sex activity: single partner, contraception - OCP (estrogen/progesterone).  Last Pap: February 26, 2018  Results were: no abnormalities  Hx of STDs: none  There is no FH of breast cancer. There is no FH of ovarian cancer. The patient does not do self-breast exams.  Tobacco use: smokes less than 1 ppd, trying to wean down Alcohol use: social drinker No drug use.  Exercise: moderately active  She does get adequate calcium but not Vitamin D in her diet.  PT IS SELF PAY  Patient Active Problem List   Diagnosis Date Noted  . Encounter for surveillance of contraceptive pills 02/26/2018  . Normal labor 08/16/2016  . Indication for care in labor and delivery, antepartum 07/28/2016  . UTI (urinary tract infection) 07/21/2016  . Encounter for supervision of low-risk first pregnancy, antepartum 07/12/2016  . PFS (patellofemoral syndrome) 12/01/2014  . Preventative health care 11/24/2014  . Pain, joint, ankle 11/24/2014  . Anxiety and depression 11/24/2014    Past Surgical History:  Procedure Laterality Date  . ESOPHAGOGASTRODUODENOSCOPY      Family History  Problem Relation Age of Onset  . Hyperlipidemia Mother   . Hypertension Mother   . Diabetes Mother   . Heart disease Maternal Grandmother   . Hypertension Maternal Grandmother   . Cancer Maternal Grandfather        lung  . Diabetes Paternal Grandmother   . Diabetes Paternal Grandfather     Social History   Socioeconomic History  . Marital status: Unknown    Spouse name: Not on file  .  Number of children: Not on file  . Years of education: Not on file  . Highest education level: Not on file  Occupational History  . Not on file  Tobacco Use  . Smoking status: Current Every Day Smoker    Packs/day: 0.50    Types: Cigarettes  . Smokeless tobacco: Never Used  Substance and Sexual Activity  . Alcohol use: No    Alcohol/week: 0.0 standard drinks  . Drug use: No  . Sexual activity: Yes    Birth control/protection: Pill  Other Topics Concern  . Not on file  Social History Narrative  . Not on file   Social Determinants of Health   Financial Resource Strain:   . Difficulty of Paying Living Expenses: Not on file  Food Insecurity:   . Worried About Charity fundraiser in the Last Year: Not on file  . Ran Out of Food in the Last Year: Not on file  Transportation Needs:   . Lack of Transportation (Medical): Not on file  . Lack of Transportation (Non-Medical): Not on file  Physical Activity:   . Days of Exercise per Week: Not on file  . Minutes of Exercise per Session: Not on file  Stress:   . Feeling of Stress : Not on file  Social Connections:   . Frequency of Communication with Friends and Family: Not on file  . Frequency of Social Gatherings with Friends and Family: Not on file  .  Attends Religious Services: Not on file  . Active Member of Clubs or Organizations: Not on file  . Attends Banker Meetings: Not on file  . Marital Status: Not on file  Intimate Partner Violence:   . Fear of Current or Ex-Partner: Not on file  . Emotionally Abused: Not on file  . Physically Abused: Not on file  . Sexually Abused: Not on file     Current Outpatient Medications:  .  amoxicillin-clavulanate (AUGMENTIN) 875-125 MG tablet, Take by mouth., Disp: , Rfl:  .  clindamycin (CLEOCIN) 150 MG capsule, 2 tablets tid until finished, Disp: 42 capsule, Rfl: 0 .  hydrOXYzine (VISTARIL) 25 MG capsule, Take by mouth., Disp: , Rfl:  .  ibuprofen (ADVIL) 200 MG tablet,  Take by mouth., Disp: , Rfl:  .  norgestimate-ethinyl estradiol (MILI) 0.25-35 MG-MCG tablet, Take 1 tablet by mouth daily., Disp: 84 tablet, Rfl: 3 .  promethazine-dextromethorphan (PROMETHAZINE-DM) 6.25-15 MG/5ML syrup, Take by mouth., Disp: , Rfl:      ROS:  Review of Systems  Constitutional: Negative for fatigue, fever and unexpected weight change.  Respiratory: Negative for cough, shortness of breath and wheezing.   Cardiovascular: Negative for chest pain, palpitations and leg swelling.  Gastrointestinal: Negative for blood in stool, constipation, diarrhea, nausea and vomiting.  Endocrine: Negative for cold intolerance, heat intolerance and polyuria.  Genitourinary: Negative for dyspareunia, dysuria, flank pain, frequency, genital sores, hematuria, menstrual problem, pelvic pain, urgency, vaginal bleeding, vaginal discharge and vaginal pain.  Musculoskeletal: Positive for arthralgias. Negative for back pain, joint swelling and myalgias.  Skin: Negative for rash.  Neurological: Negative for dizziness, syncope, light-headedness, numbness and headaches.  Hematological: Negative for adenopathy.  Psychiatric/Behavioral: Positive for agitation and dysphoric mood. Negative for confusion, sleep disturbance and suicidal ideas. The patient is not nervous/anxious.   BREAST: No symptoms   Objective: BP 110/70   Ht 5\' 11"  (1.803 m)   Wt 207 lb (93.9 kg)   LMP 04/14/2019 (Approximate)   Breastfeeding No   BMI 28.87 kg/m    Physical Exam Constitutional:      Appearance: She is well-developed.  Genitourinary:     Vulva, vagina, cervix, uterus, right adnexa and left adnexa normal.     No vulval lesion or tenderness noted.     No vaginal discharge, erythema or tenderness.     No cervical polyp.     Uterus is not enlarged or tender.     No right or left adnexal mass present.     Right adnexa not tender.     Left adnexa not tender.  Neck:     Thyroid: No thyromegaly.  Cardiovascular:      Rate and Rhythm: Normal rate and regular rhythm.     Heart sounds: Normal heart sounds. No murmur.  Pulmonary:     Effort: Pulmonary effort is normal.     Breath sounds: Normal breath sounds.  Chest:     Breasts:        Right: No mass, nipple discharge, skin change or tenderness.        Left: No mass, nipple discharge, skin change or tenderness.  Abdominal:     Palpations: Abdomen is soft.     Tenderness: There is no abdominal tenderness. There is no guarding.  Musculoskeletal:        General: Normal range of motion.     Cervical back: Normal range of motion.  Neurological:     General: No focal deficit present.  Mental Status: She is alert and oriented to person, place, and time.     Cranial Nerves: No cranial nerve deficit.  Skin:    General: Skin is warm and dry.  Psychiatric:        Mood and Affect: Mood normal.        Behavior: Behavior normal.        Thought Content: Thought content normal.        Judgment: Judgment normal.  Vitals reviewed.     Assessment/Plan: Encounter for annual routine gynecological examination  Encounter for surveillance of contraceptive pills - Plan: norgestimate-ethinyl estradiol (MILI) 0.25-35 MG-MCG tablet; OCP RF  Meds ordered this encounter  Medications  . norgestimate-ethinyl estradiol (MILI) 0.25-35 MG-MCG tablet    Sig: Take 1 tablet by mouth daily.    Dispense:  84 tablet    Refill:  3    Order Specific Question:   Supervising Provider    Answer:   Nadara Mustard [[id number]]             GYN counsel adequate intake of calcium and vitamin D, diet and exercise     F/U  Return in about 1 year (around 05/07/2020).  Adrienne Quintela B. Rossie Scarfone, PA-C 05/08/2019 10:41 AM

## 2019-05-08 ENCOUNTER — Ambulatory Visit (INDEPENDENT_AMBULATORY_CARE_PROVIDER_SITE_OTHER): Payer: Self-pay | Admitting: Obstetrics and Gynecology

## 2019-05-08 ENCOUNTER — Encounter: Payer: Self-pay | Admitting: Obstetrics and Gynecology

## 2019-05-08 ENCOUNTER — Other Ambulatory Visit: Payer: Self-pay

## 2019-05-08 VITALS — BP 110/70 | Ht 71.0 in | Wt 207.0 lb

## 2019-05-08 DIAGNOSIS — Z01419 Encounter for gynecological examination (general) (routine) without abnormal findings: Secondary | ICD-10-CM

## 2019-05-08 DIAGNOSIS — Z3041 Encounter for surveillance of contraceptive pills: Secondary | ICD-10-CM

## 2019-05-08 MED ORDER — NORGESTIMATE-ETH ESTRADIOL 0.25-35 MG-MCG PO TABS: 1.0000 | ORAL_TABLET | Freq: Every day | ORAL | 3 refills | Status: DC

## 2019-05-08 NOTE — Patient Instructions (Signed)
I value your feedback and entrusting us with your care. If you get a [facility] patient survey, I would appreciate you taking the time to let us know about your experience today. Thank you!  As of April 03, 2019, your lab results will be released to your MyChart immediately, before I even have a chance to see them. Please give me time to review them and contact you if there are any abnormalities. Thank you for your patience.  

## 2020-04-22 ENCOUNTER — Other Ambulatory Visit: Payer: Self-pay

## 2020-04-22 ENCOUNTER — Other Ambulatory Visit: Payer: Self-pay | Admitting: Obstetrics and Gynecology

## 2020-04-22 DIAGNOSIS — Z3041 Encounter for surveillance of contraceptive pills: Secondary | ICD-10-CM

## 2020-04-22 MED ORDER — NORGESTIMATE-ETH ESTRADIOL 0.25-35 MG-MCG PO TABS: 1.0000 | ORAL_TABLET | Freq: Every day | ORAL | 3 refills | Status: AC

## 2020-04-22 NOTE — Telephone Encounter (Signed)
Pt calling; needs refill of bc sent to [address], [place], [place].  [phone]  Pt aware refill eRx'd to pharm requested.  States she has an appt in Jan for annual c provider where she is.
